# Patient Record
Sex: Female | Born: 1935 | Race: White | Hispanic: No | Marital: Single | Smoking: Never smoker
Health system: Southern US, Community
[De-identification: ages and names within clinical notes are randomized; demographics above are authoritative.]

---

## 2018-01-21 ENCOUNTER — Emergency Department (HOSPITAL_COMMUNITY): Payer: Medicare HMO

## 2018-01-21 ENCOUNTER — Emergency Department (HOSPITAL_COMMUNITY)
Admission: EM | Admit: 2018-01-21 | Discharge: 2018-01-21 | Disposition: A | Discharge status: Home / Self Care | Payer: Medicare HMO | Attending: Emergency Medicine | Admitting: Emergency Medicine

## 2018-01-21 ENCOUNTER — Encounter (HOSPITAL_COMMUNITY): Payer: Self-pay | Admitting: Emergency Medicine

## 2018-01-21 ENCOUNTER — Other Ambulatory Visit: Payer: Self-pay

## 2018-01-21 DIAGNOSIS — I480 Paroxysmal atrial fibrillation: Secondary | ICD-10-CM | POA: Insufficient documentation

## 2018-01-21 DIAGNOSIS — J181 Lobar pneumonia, unspecified organism: Secondary | ICD-10-CM | POA: Diagnosis not present

## 2018-01-21 DIAGNOSIS — Z79899 Other long term (current) drug therapy: Secondary | ICD-10-CM | POA: Insufficient documentation

## 2018-01-21 DIAGNOSIS — F0390 Unspecified dementia without behavioral disturbance: Secondary | ICD-10-CM | POA: Diagnosis not present

## 2018-01-21 DIAGNOSIS — I4891 Unspecified atrial fibrillation: Secondary | ICD-10-CM

## 2018-01-21 DIAGNOSIS — J189 Pneumonia, unspecified organism: Secondary | ICD-10-CM

## 2018-01-21 DIAGNOSIS — R0602 Shortness of breath: Secondary | ICD-10-CM | POA: Diagnosis present

## 2018-01-21 LAB — CBC WITH DIFFERENTIAL/PLATELET
Abs Immature Granulocytes: 0 10*3/uL (ref 0.0–0.1)
BASOS ABS: 0.1 10*3/uL (ref 0.0–0.1)
Basophils Relative: 1 %
EOS ABS: 0.4 10*3/uL (ref 0.0–0.7)
Eosinophils Relative: 6 %
HEMATOCRIT: 40.6 % (ref 36.0–46.0)
HEMOGLOBIN: 12.8 g/dL (ref 12.0–15.0)
Immature Granulocytes: 0 %
LYMPHS ABS: 1.5 10*3/uL (ref 0.7–4.0)
LYMPHS PCT: 25 %
MCH: 31.4 pg (ref 26.0–34.0)
MCHC: 31.5 g/dL (ref 30.0–36.0)
MCV: 99.5 fL (ref 78.0–100.0)
MONO ABS: 0.6 10*3/uL (ref 0.1–1.0)
Monocytes Relative: 10 %
Neutro Abs: 3.6 10*3/uL (ref 1.7–7.7)
Neutrophils Relative %: 58 %
Platelets: 135 10*3/uL — ABNORMAL LOW (ref 150–400)
RBC: 4.08 MIL/uL (ref 3.87–5.11)
RDW: 14.9 % (ref 11.5–15.5)
WBC: 6.1 10*3/uL (ref 4.0–10.5)

## 2018-01-21 LAB — I-STAT TROPONIN, ED: Troponin i, poc: 0.04 ng/mL (ref 0.00–0.08)

## 2018-01-21 LAB — BASIC METABOLIC PANEL
Anion gap: 7 (ref 5–15)
BUN: 25 mg/dL — AB (ref 8–23)
CO2: 23 mmol/L (ref 22–32)
CREATININE: 1.47 mg/dL — AB (ref 0.44–1.00)
Calcium: 9.2 mg/dL (ref 8.9–10.3)
Chloride: 111 mmol/L (ref 98–111)
GFR calc Af Amer: 33 mL/min — ABNORMAL LOW (ref >60)
GFR, EST NON AFRICAN AMERICAN: 29 mL/min — AB (ref >60)
GLUCOSE: 94 mg/dL (ref 70–99)
POTASSIUM: 4.8 mmol/L (ref 3.5–5.1)
SODIUM: 141 mmol/L (ref 135–145)

## 2018-01-21 LAB — DIGOXIN LEVEL: Digoxin Level: <0.2 ng/mL — ABNORMAL LOW (ref 0.8–2.0)

## 2018-01-21 LAB — I-STAT CG4 LACTIC ACID, ED: Lactic Acid, Venous: 0.95 mmol/L (ref 0.5–1.9)

## 2018-01-21 MED ORDER — DOXYCYCLINE HYCLATE 100 MG PO TABS: 100.0000 mg | ORAL_TABLET | Freq: Once | ORAL | Status: DC

## 2018-01-21 MED ORDER — DOXYCYCLINE HYCLATE 100 MG PO CAPS: 100.0000 mg | ORAL_CAPSULE | Freq: Two times a day (BID) | ORAL | 0 refills | Status: AC

## 2018-01-21 MED ORDER — FUROSEMIDE 20 MG PO TABS
40.0000 mg | ORAL_TABLET | Freq: Once | ORAL | Status: AC
  Administered 2018-01-21: 40 mg via ORAL
  Filled 2018-01-21: qty 2

## 2018-01-21 MED ORDER — DILTIAZEM HCL ER COATED BEADS 120 MG PO CP24: 120.0000 mg | ORAL_CAPSULE | Freq: Every day | ORAL | 0 refills | Status: AC

## 2018-01-21 MED ORDER — ALBUTEROL SULFATE (2.5 MG/3ML) 0.083% IN NEBU
5.0000 mg | INHALATION_SOLUTION | Freq: Once | RESPIRATORY_TRACT | Status: DC
  Filled 2018-01-21: qty 6

## 2018-01-21 MED ORDER — DOXYCYCLINE HYCLATE 100 MG PO TABS
100.0000 mg | ORAL_TABLET | Freq: Once | ORAL | Status: AC
  Administered 2018-01-21: 100 mg via ORAL
  Filled 2018-01-21: qty 1

## 2018-01-21 MED ORDER — DILTIAZEM HCL 25 MG/5ML IV SOLN
10.0000 mg | Freq: Once | INTRAVENOUS | Status: AC
  Administered 2018-01-21: 10 mg via INTRAVENOUS
  Filled 2018-01-21: qty 5

## 2018-01-21 MED ORDER — DIGOXIN 125 MCG PO TABS
0.1250 mg | ORAL_TABLET | Freq: Once | ORAL | Status: AC
  Administered 2018-01-21: 0.125 mg via ORAL
  Filled 2018-01-21: qty 1

## 2018-01-21 MED ORDER — DILTIAZEM HCL ER COATED BEADS 120 MG PO CP24
120.0000 mg | ORAL_CAPSULE | Freq: Once | ORAL | Status: AC
  Administered 2018-01-21: 120 mg via ORAL
  Filled 2018-01-21: qty 1

## 2018-01-21 NOTE — ED Provider Notes (Addendum)
[place] EMERGENCY DEPARTMENT Provider Note   CSN: [id number] Arrival date & time: 01/21/18  0818     History   Chief Complaint Chief Complaint  Patient presents with  . Shortness of Breath    HPI Andrea Shepard is a 84 y.o. female.  The history is provided by the EMS personnel, a relative and the patient. No language interpreter was used.  Shortness of Breath    Andrea Shepard is a 84 y.o. female who presents to the Emergency Department complaining of sob.  Level V caveat due to dementia.  Hx is provided by EMS and daughter. The resident at [facility] place and presents to the emergency department for difficulty breathing that started today. She awoke with shortness of breath and had wheezing prior to ED arrival. No reports of recent illnesses. She is in persistent atrial fibrillation. She does have a history of congestive heart failure. She has severe Alzheimer's and is confused at baseline. History reviewed. No pertinent past medical history.  There are no active problems to display for this patient.   History reviewed. No pertinent surgical history.   OB History   None      Home Medications    Prior to Admission medications   Medication Sig Start Date End Date Taking? Authorizing Provider  acetaminophen (TYLENOL) 325 MG tablet Take 650 mg by mouth every evening.   Yes [provider]  digoxin (LANOXIN) 0.125 MG tablet Take 0.125 mg by mouth daily.   Yes [provider]  famotidine (PEPCID) 40 MG tablet Take 80 mg by mouth every morning.   Yes [provider]  furosemide (LASIX) 40 MG tablet Take 40 mg by mouth daily.   Yes [provider]  Melatonin 5 MG TABS Take 5 mg by mouth at bedtime.   Yes [provider]  mirtazapine (REMERON) 7.5 MG tablet Take 7.5 mg by mouth at bedtime.   Yes [provider]  polycarbophil (FIBERCON) 625 MG tablet Take 625 mg by mouth at bedtime.   Yes [provider]  potassium chloride SA (K-DUR,KLOR-CON) 20 MEQ tablet Take 20 mEq by mouth 2 (two) times daily.   Yes [provider]  senna (SENOKOT) 8.6 MG tablet Take 1 tablet by mouth every evening.   Yes [provider]  diltiazem (CARDIZEM CD) 120 MG 24 hr capsule Take 1 capsule (120 mg total) by mouth daily. 01/22/18   Quintella Reichert, MD  doxycycline (VIBRAMYCIN) 100 MG capsule Take 1 capsule (100 mg total) by mouth 2 (two) times daily. 01/21/18   Quintella Reichert, MD    Family History History reviewed. No pertinent family history.  Social History Social History   Tobacco Use  . Smoking status: Not on file  Substance Use Topics  . Alcohol use: Not on file  . Drug use: Not on file     Allergies   Levaquin [levofloxacin]   Review of Systems Review of Systems  Unable to perform ROS: Dementia  Respiratory: Positive for shortness of breath.      Physical Exam Updated Vital Signs BP (!) 126/96   Pulse (!) 110   Temp 98 F (36.7 C) (Rectal)   Resp (!) 22   Ht 5\' 3"  (1.6 m)   Wt 56.7 kg   SpO2 94%   BMI 22.14 kg/m   Physical Exam  Constitutional: She appears well-developed and well-nourished.  HENT:  Head: Normocephalic and atraumatic.  Cardiovascular:  No murmur heard. Irregular rhythm  Pulmonary/Chest:  Effort normal. No respiratory distress.  Decreased air movement and bilateral bases  Abdominal: Soft. There is no tenderness. There is no rebound and no guarding.  Musculoskeletal: She exhibits no edema or tenderness.  Neurological: She is alert.  Disoriented to place and time. Moves all extremities symmetrically.  Skin: Skin is warm and dry.  Psychiatric: She has a normal mood and affect. Her behavior is normal.  Nursing note and vitals reviewed.    ED Treatments / Results  Labs (all labs ordered are listed, but only abnormal results are displayed) Labs Reviewed  BASIC METABOLIC PANEL - Abnormal; Notable for the following components:      Result  Value   BUN 25 (*)    Creatinine, Ser 1.47 (*)    GFR calc non Af Amer 29 (*)    GFR calc Af Amer 33 (*)    All other components within normal limits  CBC WITH DIFFERENTIAL/PLATELET - Abnormal; Notable for the following components:   Platelets 135 (*)    All other components within normal limits  DIGOXIN LEVEL - Abnormal; Notable for the following components:   Digoxin Level <0.2 (*)    All other components within normal limits  I-STAT TROPONIN, ED  I-STAT CG4 LACTIC ACID, ED  I-STAT CG4 LACTIC ACID, ED    EKG EKG Interpretation  Date/Time:  Friday January 21 2018 08:23:21 EDT Ventricular Rate:  110 PR Interval:    QRS Duration: 74 QT Interval:  344 QTC Calculation: 466 R Axis:   0 Text Interpretation:  Atrial fibrillation Anterior infarct, old no prior available for comparison Confirmed by Quintella Reichert [phone]) on 01/21/2018 9:10:28 AM Also confirmed by Quintella Reichert [phone]), editor Lynder Parents [phone])  on 01/21/2018 3:43:56 PM   Radiology Dg Chest 2 View  Result Date: 01/21/2018 CLINICAL DATA:  Short of breath.  Confused. EXAM: CHEST - 2 VIEW COMPARISON:  None. FINDINGS: Mild enlargement of the cardiopericardial silhouette. No mediastinal or hilar masses. Hazy airspace opacity is noted in the right perihilar and lower lung regions. Left lung is clear. No convincing pleural effusion.  No pneumothorax. Skeletal structures are demineralized but intact. IMPRESSION: 1. Hazy airspace opacity in the right perihilar and lower lung zone consistent with pneumonia in the proper clinical setting. This could alternatively reflect asymmetric edema. 2. Mild cardiomegaly. Electronically Signed   By: Lajean Manes M.D.   On: 01/21/2018 09:30    Procedures Procedures (including critical care time) CRITICAL CARE Performed by: Quintella Reichert   Total critical care time: 35 minutes  Critical care time was exclusive of separately billable procedures and treating other  patients.  Critical care was necessary to treat or prevent imminent or life-threatening deterioration.  Critical care was time spent personally by me on the following activities: development of treatment plan with patient and/or surrogate as well as nursing, discussions with consultants, evaluation of patient's response to treatment, examination of patient, obtaining history from patient or surrogate, ordering and performing treatments and interventions, ordering and review of laboratory studies, ordering and review of radiographic studies, pulse oximetry and re-evaluation of patient's condition.  Medications Ordered in ED Medications  diltiazem (CARDIZEM CD) 24 hr capsule 120 mg (has no administration in time range)  doxycycline (VIBRA-TABS) tablet 100 mg (has no administration in time range)  doxycycline (VIBRA-TABS) tablet 100 mg (100 mg Oral Given 01/21/18 1248)  digoxin (LANOXIN) tablet 0.125 mg (0.125 mg Oral Given 01/21/18 1626)  furosemide (LASIX) tablet 40 mg (40 mg Oral Given 01/21/18 1625)  diltiazem (CARDIZEM) injection 10 mg (10 mg Intravenous Given 01/21/18 1751)     Initial Impression / Assessment and Plan / ED Course  I have reviewed the triage vital signs and the nursing notes.  Pertinent labs & imaging results that were available during my care of the patient were reviewed by me and considered in my medical decision making (see chart for details).     Patient with history of Alzheimer's here for evaluation of shortness of breath. She is markedly confused on ED arrival but alert and at her baseline. She has no respiratory distress on initial evaluation. Discussed with patient's daughter, [name] at [phone]. Plan to obtain chest x-ray and labs and reassess. Patient's daughter would prefer outpatient management of current illness if this is possible.  There was delay in obtaining digoxin level. On repeat assessment patient does have mild agitation and is now in a fib with  RVR rates 130s to 160s. Pt did miss her morning digoxin dose and this was administered during ED stay. Attempted multiple times to recontact daughter regarding change in patient status. At 1800 able to contact patient's daughter at [phone]. Discussed change in status with a fib with RVR. Patient's heart rate did improve following Cardizem bolus. Discussed recommendation for admission.  Daughter would like to try outpatient management as patient would not want any invasive interventions and wants to avoid all hospitalizations due to advanced dementia.  Final Clinical Impressions(s) / ED Diagnoses   Final diagnoses:  Atrial fibrillation with rapid ventricular response ([place])  Community acquired pneumonia of right lower lobe of lung [facility])    ED Discharge Orders         Ordered    diltiazem (CARDIZEM CD) 120 MG 24 hr capsule  Daily     01/21/18 1806    doxycycline (VIBRAMYCIN) 100 MG capsule  2 times daily     01/21/18 1806           Quintella Reichert, MD 01/21/18 Cecille Amsterdam    Quintella Reichert, MD 01/21/18 1813    Quintella Reichert, MD 01/31/18 2310

## 2018-01-21 NOTE — ED Notes (Signed)
Pt ambulatory in hallway with no assistance from this NT. Required minimal assistance while standing and direction when sitting and returning to bedside. Pt SpO2 93% prior to ambulating, and was 95% or higher while ambulating. SpO2 100% upon returning to bedside.

## 2018-01-21 NOTE — ED Triage Notes (Addendum)
Per EMS pt is from [place], she woke up at 0600 and stated she was SOB and the staff said she was wheezing.  When EMS arrived they did not notice any distress or wheezing.  Pt denies pain, dizziness or SOB.  Pt not on O2 currently, and is not on it at the facility.  It was noted that she was in AFIB in route and to their knowledge she is not on blood thinners.   NAD noted at this time AOx3 history of Dementia GCS of 14

## 2018-01-21 NOTE — Discharge Instructions (Addendum)
Andrea Shepard was seen in the emergency department for shortness of breath today. She has a possible pneumonia in her right lung and she was also found to have rapid atrial fibrillation. Please continue her home medications. She received two doses of doxycycline today in the emergency department and her next dose will be tomorrow morning. She received a dose of Cardizem in the emergency department as well and her next dose will be tomorrow morning. Have her rechecked if she has any new or concerning symptoms.

## 2018-01-21 NOTE — ED Notes (Signed)
Pt ambulated to bathroom and back to room.

## 2019-04-25 ENCOUNTER — Other Ambulatory Visit: Payer: Self-pay

## 2019-04-25 ENCOUNTER — Emergency Department (HOSPITAL_COMMUNITY): Payer: Medicare HMO

## 2019-04-25 ENCOUNTER — Inpatient Hospital Stay (HOSPITAL_COMMUNITY)
Admission: EM | Admit: 2019-04-25 | Discharge: 2019-05-29 | DRG: 177 | Disposition: E | Payer: Medicare HMO | Source: Skilled Nursing Facility | Attending: Internal Medicine | Admitting: Internal Medicine

## 2019-04-25 DIAGNOSIS — Z79890 Hormone replacement therapy (postmenopausal): Secondary | ICD-10-CM

## 2019-04-25 DIAGNOSIS — F0280 Dementia in other diseases classified elsewhere without behavioral disturbance: Secondary | ICD-10-CM | POA: Diagnosis present

## 2019-04-25 DIAGNOSIS — G9341 Metabolic encephalopathy: Secondary | ICD-10-CM | POA: Diagnosis present

## 2019-04-25 DIAGNOSIS — Z515 Encounter for palliative care: Secondary | ICD-10-CM | POA: Diagnosis not present

## 2019-04-25 DIAGNOSIS — N179 Acute kidney failure, unspecified: Secondary | ICD-10-CM | POA: Diagnosis present

## 2019-04-25 DIAGNOSIS — R4182 Altered mental status, unspecified: Secondary | ICD-10-CM | POA: Diagnosis not present

## 2019-04-25 DIAGNOSIS — E860 Dehydration: Secondary | ICD-10-CM | POA: Diagnosis present

## 2019-04-25 DIAGNOSIS — A419 Sepsis, unspecified organism: Secondary | ICD-10-CM | POA: Diagnosis not present

## 2019-04-25 DIAGNOSIS — Z781 Physical restraint status: Secondary | ICD-10-CM

## 2019-04-25 DIAGNOSIS — Z66 Do not resuscitate: Secondary | ICD-10-CM | POA: Diagnosis present

## 2019-04-25 DIAGNOSIS — U071 COVID-19: Secondary | ICD-10-CM | POA: Diagnosis present

## 2019-04-25 DIAGNOSIS — T68XXXA Hypothermia, initial encounter: Secondary | ICD-10-CM | POA: Diagnosis present

## 2019-04-25 DIAGNOSIS — Z681 Body mass index (BMI) 19 or less, adult: Secondary | ICD-10-CM | POA: Diagnosis not present

## 2019-04-25 DIAGNOSIS — Z79899 Other long term (current) drug therapy: Secondary | ICD-10-CM | POA: Diagnosis not present

## 2019-04-25 DIAGNOSIS — J1282 Pneumonia due to coronavirus disease 2019: Secondary | ICD-10-CM | POA: Diagnosis present

## 2019-04-25 DIAGNOSIS — R636 Underweight: Secondary | ICD-10-CM | POA: Diagnosis present

## 2019-04-25 DIAGNOSIS — R339 Retention of urine, unspecified: Secondary | ICD-10-CM | POA: Diagnosis present

## 2019-04-25 DIAGNOSIS — R627 Adult failure to thrive: Secondary | ICD-10-CM | POA: Diagnosis present

## 2019-04-25 DIAGNOSIS — F0390 Unspecified dementia without behavioral disturbance: Secondary | ICD-10-CM | POA: Diagnosis present

## 2019-04-25 DIAGNOSIS — G309 Alzheimer's disease, unspecified: Secondary | ICD-10-CM | POA: Diagnosis not present

## 2019-04-25 DIAGNOSIS — E870 Hyperosmolality and hypernatremia: Secondary | ICD-10-CM | POA: Diagnosis present

## 2019-04-25 DIAGNOSIS — X58XXXA Exposure to other specified factors, initial encounter: Secondary | ICD-10-CM | POA: Diagnosis present

## 2019-04-25 DIAGNOSIS — Z881 Allergy status to other antibiotic agents status: Secondary | ICD-10-CM

## 2019-04-25 DIAGNOSIS — Z800 Family history of malignant neoplasm of digestive organs: Secondary | ICD-10-CM

## 2019-04-25 DIAGNOSIS — Z7189 Other specified counseling: Secondary | ICD-10-CM | POA: Diagnosis not present

## 2019-04-25 DIAGNOSIS — N184 Chronic kidney disease, stage 4 (severe): Secondary | ICD-10-CM | POA: Diagnosis present

## 2019-04-25 LAB — CBC WITH DIFFERENTIAL/PLATELET
Abs Immature Granulocytes: 0.12 10*3/uL — ABNORMAL HIGH (ref 0.00–0.07)
Basophils Absolute: 0 10*3/uL (ref 0.0–0.1)
Basophils Relative: 0 %
Eosinophils Absolute: 0 10*3/uL (ref 0.0–0.5)
Eosinophils Relative: 0 %
HCT: 65.4 % — ABNORMAL HIGH (ref 36.0–46.0)
Hemoglobin: 20.4 g/dL — ABNORMAL HIGH (ref 12.0–15.0)
Immature Granulocytes: 1 %
Lymphocytes Relative: 3 %
Lymphs Abs: 0.6 10*3/uL — ABNORMAL LOW (ref 0.7–4.0)
MCH: 31 pg (ref 26.0–34.0)
MCHC: 31.2 g/dL (ref 30.0–36.0)
MCV: 99.4 fL (ref 80.0–100.0)
Monocytes Absolute: 0.5 10*3/uL (ref 0.1–1.0)
Monocytes Relative: 3 %
Neutro Abs: 15.3 10*3/uL — ABNORMAL HIGH (ref 1.7–7.7)
Neutrophils Relative %: 93 %
Platelets: 166 10*3/uL (ref 150–400)
RBC: 6.58 MIL/uL — ABNORMAL HIGH (ref 3.87–5.11)
RDW: 14.8 % (ref 11.5–15.5)
WBC: 16.5 10*3/uL — ABNORMAL HIGH (ref 4.0–10.5)
nRBC: 0 % (ref 0.0–0.2)

## 2019-04-25 LAB — COMPREHENSIVE METABOLIC PANEL
ALT: 95 U/L — ABNORMAL HIGH (ref 0–44)
AST: 57 U/L — ABNORMAL HIGH (ref 15–41)
Albumin: 4 g/dL (ref 3.5–5.0)
Alkaline Phosphatase: 80 U/L (ref 38–126)
Anion gap: 14 (ref 5–15)
BUN: 98 mg/dL — ABNORMAL HIGH (ref 8–23)
CO2: 27 mmol/L (ref 22–32)
Calcium: 9.6 mg/dL (ref 8.9–10.3)
Chloride: 116 mmol/L — ABNORMAL HIGH (ref 98–111)
Creatinine, Ser: 1.81 mg/dL — ABNORMAL HIGH (ref 0.44–1.00)
GFR calc Af Amer: 27 mL/min — ABNORMAL LOW (ref >60)
GFR calc non Af Amer: 23 mL/min — ABNORMAL LOW (ref >60)
Glucose, Bld: 121 mg/dL — ABNORMAL HIGH (ref 70–99)
Potassium: 4.6 mmol/L (ref 3.5–5.1)
Sodium: 157 mmol/L — ABNORMAL HIGH (ref 135–145)
Total Bilirubin: 0.9 mg/dL (ref 0.3–1.2)
Total Protein: 7.7 g/dL (ref 6.5–8.1)

## 2019-04-25 LAB — URINALYSIS, ROUTINE W REFLEX MICROSCOPIC
Bilirubin Urine: NEGATIVE
Glucose, UA: NEGATIVE mg/dL
Hgb urine dipstick: NEGATIVE
Ketones, ur: NEGATIVE mg/dL
Leukocytes,Ua: NEGATIVE
Nitrite: NEGATIVE
Protein, ur: NEGATIVE mg/dL
Specific Gravity, Urine: 1.011 (ref 1.005–1.030)
pH: 5 (ref 5.0–8.0)

## 2019-04-25 LAB — PROTIME-INR
INR: 1 (ref 0.8–1.2)
Prothrombin Time: 13 seconds (ref 11.4–15.2)

## 2019-04-25 LAB — LACTIC ACID, PLASMA: Lactic Acid, Venous: 2.3 mmol/L (ref 0.5–1.9)

## 2019-04-25 LAB — APTT: aPTT: 28 seconds (ref 24–36)

## 2019-04-25 LAB — DIGOXIN LEVEL: Digoxin Level: 1.7 ng/mL (ref 0.8–2.0)

## 2019-04-25 MED ORDER — VANCOMYCIN HCL IN DEXTROSE 1-5 GM/200ML-% IV SOLN
1000.0000 mg | Freq: Once | INTRAVENOUS | Status: AC
  Administered 2019-04-25: 1000 mg via INTRAVENOUS
  Filled 2019-04-25: qty 200

## 2019-04-25 MED ORDER — LORAZEPAM 2 MG/ML IJ SOLN
0.5000 mg | Freq: Four times a day (QID) | INTRAMUSCULAR | Status: DC | PRN
  Administered 2019-04-26 – 2019-04-28 (×3): 0.5 mg via INTRAVENOUS
  Filled 2019-04-25 (×4): qty 1

## 2019-04-25 MED ORDER — ACETAMINOPHEN 325 MG PO TABS: 650.0000 mg | ORAL_TABLET | Freq: Four times a day (QID) | ORAL | Status: DC | PRN

## 2019-04-25 MED ORDER — SODIUM CHLORIDE 0.9 % IV SOLN: INTRAVENOUS | Status: DC

## 2019-04-25 MED ORDER — ONDANSETRON HCL 4 MG PO TABS: 4.0000 mg | ORAL_TABLET | Freq: Four times a day (QID) | ORAL | Status: DC | PRN

## 2019-04-25 MED ORDER — ACETAMINOPHEN 650 MG RE SUPP: 650.0000 mg | Freq: Four times a day (QID) | RECTAL | Status: DC | PRN

## 2019-04-25 MED ORDER — SODIUM CHLORIDE 0.9 % IV SOLN
2.0000 g | Freq: Once | INTRAVENOUS | Status: AC
  Administered 2019-04-25: 2 g via INTRAVENOUS
  Filled 2019-04-25: qty 2

## 2019-04-25 MED ORDER — METRONIDAZOLE IN NACL 5-0.79 MG/ML-% IV SOLN
500.0000 mg | Freq: Once | INTRAVENOUS | Status: AC
  Administered 2019-04-25: 500 mg via INTRAVENOUS
  Filled 2019-04-25: qty 100

## 2019-04-25 MED ORDER — ONDANSETRON HCL 4 MG/2ML IJ SOLN: 4.0000 mg | Freq: Four times a day (QID) | INTRAMUSCULAR | Status: DC | PRN

## 2019-04-25 MED ORDER — ENOXAPARIN SODIUM 30 MG/0.3ML ~~LOC~~ SOLN
30.0000 mg | Freq: Every day | SUBCUTANEOUS | Status: DC
  Administered 2019-04-26: 30 mg via SUBCUTANEOUS
  Filled 2019-04-25: qty 0.3

## 2019-04-25 MED ORDER — SODIUM CHLORIDE 0.9 % IV BOLUS (SEPSIS)
1000.0000 mL | Freq: Once | INTRAVENOUS | Status: AC
  Administered 2019-04-25: 1000 mL via INTRAVENOUS

## 2019-04-25 NOTE — Progress Notes (Signed)
A consult was received from an ED physician for Vancomycin & Cefepime per pharmacy dosing.  The patient's profile has been reviewed for ht/wt/allergies/indication/available labs. No current weight, last weight noted was 01/21/2018 of 57 kg   A one time order has been placed for Vancomycin 1gm, Flagyl 500mg  and Cefepime 2gm per ED MD.  Further antibiotics/pharmacy consults should be ordered by admitting physician if indicated.                       Thank you,  Minda Ditto PharmD 03/29/2019  8:14 PM

## 2019-04-25 NOTE — ED Notes (Signed)
Date and time results received: 04/12/2019 2002 (use smartphrase ".now" to insert current time)  Test: Lactic Acid Critical Value: 2.3  Name of Provider Notified: Tamera Punt, MD  Orders Received? Or Actions Taken?: Orders Received - See Orders for details

## 2019-04-25 NOTE — ED Notes (Signed)
Pt transferred to CT.

## 2019-04-25 NOTE — ED Notes (Signed)
Fluid bolus and IV antibiotic infused repeat lactic acid done now.

## 2019-04-25 NOTE — ED Provider Notes (Signed)
[place] DEPT Provider Note   CSN: [id number] Arrival date & time: 03/29/2019  [id number]     History Chief Complaint  Patient presents with  . Altered Mental Status    Andrea Shepard is a 85 y.o. female.  Patient is a 85 year old female who resides at [facility] facility.  She was brought in by EMS for increased weakness.  She has a history of dementia and according to EMS has had declining over the last 2 days.  She normally is ambulatory and active but she has not been out of her bed since yesterday and has not been eating or drinking.  She has confusion at baseline.  She reportedly had Covid earlier in December but has had a negative test since that time.  History is limited due to patient's dementia.        No past medical history on file.  There are no problems to display for this patient.   No past surgical history on file.   OB History   No obstetric history on file.     No family history on file.  Social History   Tobacco Use  . Smoking status: Not on file  Substance Use Topics  . Alcohol use: Not on file  . Drug use: Not on file    Home Medications Prior to Admission medications   Medication Sig Start Date End Date Taking? Authorizing Provider  acetaminophen (TYLENOL) 325 MG tablet Take 650 mg by mouth every 6 (six) hours as needed for moderate pain or fever.   Yes [provider]  dexamethasone (DECADRON) 6 MG tablet Take 6 mg by mouth daily. For 10 days started on 12.21.2020   Yes [provider]  digoxin (LANOXIN) 0.125 MG tablet Take 0.125 mg by mouth daily.   Yes [provider]  diltiazem (CARDIZEM CD) 120 MG 24 hr capsule Take 1 capsule (120 mg total) by mouth daily. 01/22/18  Yes Quintella Reichert, MD  famotidine (PEPCID) 20 MG tablet Take 20 mg by mouth 2 (two) times daily as needed for heartburn.    Yes [provider]  furosemide (LASIX) 40 MG tablet Take 40 mg by mouth  daily.   Yes [provider]  levothyroxine (SYNTHROID) 25 MCG tablet Take 25 mcg by mouth daily before breakfast.   Yes [provider]  mirtazapine (REMERON) 7.5 MG tablet Take 7.5 mg by mouth at bedtime.   Yes [provider]  polycarbophil (FIBERCON) 625 MG tablet Take 625 mg by mouth at bedtime.   Yes [provider]  potassium chloride SA (K-DUR,KLOR-CON) 20 MEQ tablet Take 20 mEq by mouth at bedtime.    Yes [provider]  senna (SENOKOT) 8.6 MG tablet Take 1 tablet by mouth every evening.   Yes [provider]  doxycycline (VIBRAMYCIN) 100 MG capsule Take 1 capsule (100 mg total) by mouth 2 (two) times daily. Patient not taking: Reported on 04/03/2019 01/21/18   Quintella Reichert, MD    Allergies    Levaquin [levofloxacin]  Review of Systems   Review of Systems  Unable to perform ROS: Dementia    Physical Exam Updated Vital Signs BP 112/75 (BP Location: Left Arm)   Pulse 78   Temp (!) 95.5 F (35.3 C) (Rectal)   Resp 14   SpO2 97%   Physical Exam Constitutional:      Appearance: She is well-developed.  HENT:     Head: Normocephalic and atraumatic.  Mouth/Throat:     Mouth: Mucous membranes are dry.  Eyes:     Pupils: Pupils are equal, round, and reactive to light.  Cardiovascular:     Rate and Rhythm: Normal rate. Rhythm irregular.     Heart sounds: Normal heart sounds.  Pulmonary:     Effort: Pulmonary effort is normal. No respiratory distress.     Breath sounds: Normal breath sounds. No wheezing or rales.  Chest:     Chest wall: No tenderness.  Abdominal:     General: Bowel sounds are normal.     Palpations: Abdomen is soft.     Tenderness: There is abdominal tenderness (Diffuse tenderness on palpation). There is no guarding or rebound.  Musculoskeletal:        General: Normal range of motion.     Cervical back: Normal range of motion and neck supple.  Lymphadenopathy:     Cervical: No cervical  adenopathy.  Skin:    General: Skin is warm and dry.     Findings: No rash.  Neurological:     Mental Status: She is alert.     Comments: Patient is awake with eyes open but will not answer questions.  Will not follow commands.  I do not appreciate any focal deficits.     ED Results / Procedures / Treatments   Labs (all labs ordered are listed, but only abnormal results are displayed) Labs Reviewed  LACTIC ACID, PLASMA - Abnormal; Notable for the following components:      Result Value   Lactic Acid, Venous 2.3 (*)    All other components within normal limits  COMPREHENSIVE METABOLIC PANEL - Abnormal; Notable for the following components:   Sodium 157 (*)    Chloride 116 (*)    Glucose, Bld 121 (*)    BUN 98 (*)    Creatinine, Ser 1.81 (*)    AST 57 (*)    ALT 95 (*)    GFR calc non Af Amer 23 (*)    GFR calc Af Amer 27 (*)    All other components within normal limits  CBC WITH DIFFERENTIAL/PLATELET - Abnormal; Notable for the following components:   WBC 16.5 (*)    RBC 6.58 (*)    Hemoglobin 20.4 (*)    HCT 65.4 (*)    Neutro Abs 15.3 (*)    Lymphs Abs 0.6 (*)    Abs Immature Granulocytes 0.12 (*)    All other components within normal limits  CULTURE, BLOOD (ROUTINE X 2)  CULTURE, BLOOD (ROUTINE X 2)  URINE CULTURE  SARS CORONAVIRUS 2 (TAT 6-24 HRS)  APTT  PROTIME-INR  DIGOXIN LEVEL  LACTIC ACID, PLASMA  URINALYSIS, ROUTINE W REFLEX MICROSCOPIC    EKG EKG Interpretation  Date/Time:  Tuesday April 25 2019 18:11:38 EST Ventricular Rate:  67 PR Interval:    QRS Duration: 82 QT Interval:  384 QTC Calculation: 406 R Axis:   -52 Text Interpretation: Atrial fibrillation Ventricular premature complex LVH with secondary repolarization abnormality Inferior infarct, old Anterior infarct, age indeterminate new ST depression Confirmed by Malvin Johns [phone]) on 04/19/2019 6:21:41 PM   Radiology CT Abdomen Pelvis Wo Contrast  Result Date: 04/26/2019 CLINICAL  DATA:  Weakness. EXAM: CT ABDOMEN AND PELVIS WITHOUT CONTRAST TECHNIQUE: Multidetector CT imaging of the abdomen and pelvis was performed following the standard protocol without IV contrast. COMPARISON:  None. FINDINGS: Lower chest: No acute abnormality. Scarring/atelectasis at the right lung base. Hepatobiliary: No focal liver abnormality. Status post cholecystectomy. Moderate  intra and extrahepatic biliary dilatation. Pancreas: Atrophic. No ductal dilatation or surrounding inflammatory changes. Spleen: Normal in size without focal abnormality. Adrenals/Urinary Tract: The adrenal glands are unremarkable. Left greater than right renal atrophy. 1.4 cm hyperdense lesion in the upper pole of the left kidney likely a proteinaceous or hemorrhagic cyst. No renal calculi or hydronephrosis. Moderately distended bladder without wall thickening. Stomach/Bowel: Stomach is within normal limits. Appendix appears normal. No evidence of bowel wall thickening, distention, or inflammatory changes. Left-sided colonic diverticulosis. Vascular/Lymphatic: Aortic atherosclerosis. No enlarged abdominal or pelvic lymph nodes. Reproductive: Uterus and bilateral adnexa are unremarkable. Other: No abdominal wall hernia or abnormality. No abdominopelvic ascites. No pneumoperitoneum. Musculoskeletal: No acute or significant osseous findings. IMPRESSION: 1.  No acute intra-abdominal process. 2. Moderate intra and extrahepatic biliary dilatation may be related to post cholecystectomy state. Correlate with LFTs. 3. Moderately distended bladder.  Correlate for urinary retention. 4.  Aortic atherosclerosis (ICD10-I70.0). Electronically Signed   By: Titus Dubin M.D.   On: 04/22/2019 19:28   CT Head Wo Contrast  Result Date: 04/12/2019 CLINICAL DATA:  Altered mental status EXAM: CT HEAD WITHOUT CONTRAST TECHNIQUE: Contiguous axial images were obtained from the base of the skull through the vertex without intravenous contrast. COMPARISON:   None. FINDINGS: Brain: No evidence of acute infarction, hemorrhage, extra-axial collection, ventriculomegaly, or mass effect. 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 Mild encephalomalacia of the right temporal lobe. Generalized cerebral atrophy. Periventricular white matter low attenuation likely secondary to microangiopathy. Vascular: Cerebrovascular atherosclerotic calcifications are noted. Skull: Negative for fracture or focal lesion. Sinuses/Orbits: Visualized portions of the orbits are unremarkable. Visualized portions of the paranasal sinuses are unremarkable. Visualized portions of the mastoid air cells are unremarkable. Other: None. IMPRESSION: 1.   1.  No acute intracranial pathology. 2. Chronic microvascular disease and cerebral atrophy. 3. 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 Mild encephalomalacia of the right temporal lobe. Electronically Signed   By: Kathreen Devoid   On: 03/28/2019 19:24   DG Chest Port 1 View  Result Date: 03/28/2019 CLINICAL DATA:  Acute altered mental status and weakness. EXAM: PORTABLE CHEST 1 VIEW COMPARISON:  01/21/2018 FINDINGS: Stable mild cardiomegaly. Aortic atherosclerosis incidentally noted. Mild scarring is seen in the right lung base. No evidence of pulmonary infiltrate or edema. No evidence of pneumothorax or pleural effusion. IMPRESSION: Mild cardiomegaly.  No active lung disease. Electronically Signed   By: Marlaine Hind M.D.   On: 03/29/2019 19:12    Procedures Procedures (including critical care time)  Medications Ordered in ED Medications  ceFEPIme (MAXIPIME) 2 g in sodium chloride 0.9 % 100 mL IVPB (has no administration in time range)  metroNIDAZOLE (FLAGYL) IVPB 500 mg (has no administration in time range)  vancomycin (VANCOCIN) IVPB 1000 mg/200 mL premix (has no administration in time range)  sodium chloride 0.9 % bolus 1,000 mL (has no administration in time range)    ED Course  I  have reviewed the triage vital signs and the nursing notes.  Pertinent labs & imaging results that were available during my care of the patient were reviewed by me and considered in my medical decision making (see chart for details).    MDM Rules/Calculators/A&P                      Patient is a 85 year old female who presents with altered mental status.  She was hypothermic on arrival and was treated for presumed sepsis.  She was placed on a [employer].  Her white count is noted to be elevated.  She was tender in her abdomen on exam so a CT scan was performed which showed no acute abnormality other than some possible urinary retention.  Her chest x-ray shows no evidence of pneumonia or signs of Covid.  Her Covid test is pending.  Her other labs are nonconcerning.  She has not been hypotensive.  She was treated with IV fluids and broad-spectrum antibiotics.  We will go ahead and place a Foley catheter for the probable urinary retention and a urinalysis is still pending.  I spoke with Dr. Shanon Brow who will admit the patient for further treatment.  CRITICAL CARE Performed by: Malvin Johns Total critical care time: 60 minutes Critical care time was exclusive of separately billable procedures and treating other patients. Critical care was necessary to treat or prevent imminent or life-threatening deterioration. Critical care was time spent personally by me on the following activities: development of treatment plan with patient and/or surrogate as well as nursing, discussions with consultants, evaluation of patient's response to treatment, examination of patient, obtaining history from patient or surrogate, ordering and performing treatments and interventions, ordering and review of laboratory studies, ordering and review of radiographic studies, pulse oximetry and re-evaluation of patient's condition.  Final Clinical Impression(s) / ED Diagnoses Final diagnoses:  Altered mental status, unspecified  altered mental status type  Sepsis, due to unspecified organism, unspecified whether acute organ dysfunction present [facility])  Urinary retention    Rx / DC Orders ED Discharge Orders    None       Malvin Johns, MD 04/17/2019 2029

## 2019-04-25 NOTE — ED Notes (Signed)
The patients daughter, Zane Herald, said she is POA and would like a call with an update. [phone].

## 2019-04-25 NOTE — H&P (Signed)
History and Physical    Andrea Shepard [id number] DOB: February 25, 1920 DOA: 04/01/2019  PCP: [name], MD  Patient coming from: Nursing home  Chief Complaint: Confusion  HPI: Andrea Shepard is a 85 y.o. female with medical history significant of dementia from nursing home sent in because of altered mental status and not acting her normal.  At patient's baseline she is ambulatory and interactive she had a marked decline in the last 24 hours.  No history can be obtained from patient due to her dementia.  On arrival her temperature was found to be 95.5 with an elevated white count.  Patient thought to be septic of unclear source.  Her Covid screen is pending.  She is been placed on a bear hugger and referred for admission for likely sepsis probably from UTI.   Review of Systems: Unobtainable secondary to dementia  No past medical history on file.  Unknown due to him dementia  No past surgical history on file.  Unknown due to dementia   has no history on file for tobacco, alcohol, and drug.  Unknown due to dementia  Allergies  Allergen Reactions  . Levaquin [Levofloxacin] Other (See Comments)    unknown reaction    No family history on file.  Unknown due to dementia  Prior to Admission medications   Medication Sig Start Date End Date Taking? Authorizing Provider  acetaminophen (TYLENOL) 325 MG tablet Take 650 mg by mouth every 6 (six) hours as needed for moderate pain or fever.   Yes [provider]  dexamethasone (DECADRON) 6 MG tablet Take 6 mg by mouth daily. For 10 days started on 12.21.2020   Yes [provider]  digoxin (LANOXIN) 0.125 MG tablet Take 0.125 mg by mouth daily.   Yes [provider]  diltiazem (CARDIZEM CD) 120 MG 24 hr capsule Take 1 capsule (120 mg total) by mouth daily. 01/22/18  Yes Quintella Reichert, MD  famotidine (PEPCID) 20 MG tablet Take 20 mg by mouth 2 (two) times daily as needed for heartburn.    Yes [provider]    furosemide (LASIX) 40 MG tablet Take 40 mg by mouth daily.   Yes [provider]  levothyroxine (SYNTHROID) 25 MCG tablet Take 25 mcg by mouth daily before breakfast.   Yes [provider]  mirtazapine (REMERON) 7.5 MG tablet Take 7.5 mg by mouth at bedtime.   Yes [provider]  polycarbophil (FIBERCON) 625 MG tablet Take 625 mg by mouth at bedtime.   Yes [provider]  potassium chloride SA (K-DUR,KLOR-CON) 20 MEQ tablet Take 20 mEq by mouth at bedtime.    Yes [provider]  senna (SENOKOT) 8.6 MG tablet Take 1 tablet by mouth every evening.   Yes [provider]  doxycycline (VIBRAMYCIN) 100 MG capsule Take 1 capsule (100 mg total) by mouth 2 (two) times daily. Patient not taking: Reported on 04/14/2019 01/21/18   Quintella Reichert, MD    Physical Exam: Vitals:   04/10/2019 1811 04/02/2019 2044  BP: 112/75   Pulse: 78   Resp: 14   Temp: (!) 95.5 F (35.3 C) (!) 95.5 F (35.3 C)  TempSrc: Rectal Rectal  SpO2: 97%       Constitutional: NAD, calm, comfortable but mildly agitated soft restraints Vitals:   04/06/2019 1811 04/09/2019 2044  BP: 112/75   Pulse: 78   Resp: 14   Temp: (!) 95.5 F (35.3 C) (!) 95.5 F (35.3 C)  TempSrc: Rectal Rectal  SpO2: 97%  Eyes: PERRL, lids and conjunctivae normal ENMT: Mucous membranes are moist. Posterior pharynx clear of any exudate or lesions.Normal dentition.  Neck: normal, supple, no masses, no thyromegaly Respiratory: clear to auscultation bilaterally, no wheezing, no crackles. Normal respiratory effort. No accessory muscle use.  Cardiovascular: Regular rate and rhythm, no murmurs / rubs / gallops. No extremity edema. 2+ pedal pulses. No carotid bruits.  Abdomen: no tenderness, no masses palpated. No hepatosplenomegaly. Bowel sounds positive.  Musculoskeletal: no clubbing / cyanosis. No joint deformity upper and lower extremities. Good ROM, no contractures. Normal muscle tone.  Skin:  no rashes, lesions, ulcers. No induration Neurologic: CN 2-12 grossly intact. Sensation intact, DTR normal. Strength 5/5 in all 4.  Psychiatric: Not normal judgment and insight. Alert and oriented x 0.  Confused and agitated   Labs on Admission: I have personally reviewed following labs and imaging studies  CBC: Recent Labs  Lab 04/16/2019 1838  WBC 16.5*  NEUTROABS 15.3*  HGB 20.4*  HCT 65.4*  MCV 99.4  PLT [id number]   Basic Metabolic Panel: Recent Labs  Lab 04/15/2019 1838  NA 157*  K 4.6  CL 116*  CO2 27  GLUCOSE 121*  BUN 98*  CREATININE 1.81*  CALCIUM 9.6   GFR: CrCl cannot be calculated (Unknown ideal weight.). Liver Function Tests: Recent Labs  Lab 04/04/2019 1838  AST 57*  ALT 95*  ALKPHOS 80  BILITOT 0.9  PROT 7.7  ALBUMIN 4.0   No results for input(s): LIPASE, AMYLASE in the last 168 hours. No results for input(s): AMMONIA in the last 168 hours. Coagulation Profile: Recent Labs  Lab 04/11/2019 1838  INR 1.0   Cardiac Enzymes: No results for input(s): CKTOTAL, CKMB, CKMBINDEX, TROPONINI in the last 168 hours. BNP (last 3 results) No results for input(s): PROBNP in the last 8760 hours. HbA1C: No results for input(s): HGBA1C in the last 72 hours. CBG: No results for input(s): GLUCAP in the last 168 hours. Lipid Profile: No results for input(s): CHOL, HDL, LDLCALC, TRIG, CHOLHDL, LDLDIRECT in the last 72 hours. Thyroid Function Tests: No results for input(s): TSH, T4TOTAL, FREET4, T3FREE, THYROIDAB in the last 72 hours. Anemia Panel: No results for input(s): VITAMINB12, FOLATE, FERRITIN, TIBC, IRON, RETICCTPCT in the last 72 hours. Urine analysis:    Component Value Date/Time   COLORURINE YELLOW 04/17/2019 1838   APPEARANCEUR CLEAR 04/13/2019 1838   LABSPEC 1.011 04/16/2019 1838   PHURINE 5.0 04/09/2019 1838   GLUCOSEU NEGATIVE 04/04/2019 1838   HGBUR NEGATIVE 04/24/2019 1838   BILIRUBINUR NEGATIVE 04/11/2019 1838   Benjamin Stain NEGATIVE 04/15/2019  1838   PROTEINUR NEGATIVE 04/13/2019 1838   NITRITE NEGATIVE 03/30/2019 1838   LEUKOCYTESUR NEGATIVE 04/20/2019 1838   Sepsis Labs: !!!!!!!!!!!!!!!!!!!!!!!!!!!!!!!!!!!!!!!!!!!! @LABRCNTIP (procalcitonin:4,lacticidven:4) )No results found for this or any previous visit (from the past 240 hour(s)).   Radiological Exams on Admission: CT Abdomen Pelvis Wo Contrast  Result Date: 04/19/2019 CLINICAL DATA:  Weakness. EXAM: CT ABDOMEN AND PELVIS WITHOUT CONTRAST TECHNIQUE: Multidetector CT imaging of the abdomen and pelvis was performed following the standard protocol without IV contrast. COMPARISON:  None. FINDINGS: Lower chest: No acute abnormality. Scarring/atelectasis at the right lung base. Hepatobiliary: No focal liver abnormality. Status post cholecystectomy. Moderate intra and extrahepatic biliary dilatation. Pancreas: Atrophic. No ductal dilatation or surrounding inflammatory changes. Spleen: Normal in size without focal abnormality. Adrenals/Urinary Tract: The adrenal glands are unremarkable. Left greater than right renal atrophy. 1.4 cm hyperdense lesion in the upper pole of the left kidney likely a proteinaceous or hemorrhagic  cyst. No renal calculi or hydronephrosis. Moderately distended bladder without wall thickening. Stomach/Bowel: Stomach is within normal limits. Appendix appears normal. No evidence of bowel wall thickening, distention, or inflammatory changes. Left-sided colonic diverticulosis. Vascular/Lymphatic: Aortic atherosclerosis. No enlarged abdominal or pelvic lymph nodes. Reproductive: Uterus and bilateral adnexa are unremarkable. Other: No abdominal wall hernia or abnormality. No abdominopelvic ascites. No pneumoperitoneum. Musculoskeletal: No acute or significant osseous findings. IMPRESSION: 1.  No acute intra-abdominal process. 2. Moderate intra and extrahepatic biliary dilatation may be related to post cholecystectomy state. Correlate with LFTs. 3. Moderately distended bladder.   Correlate for urinary retention. 4.  Aortic atherosclerosis (ICD10-I70.0). Electronically Signed   By: Titus Dubin M.D.   On: 04/07/2019 19:28   CT Head Wo Contrast  Result Date: 04/16/2019 CLINICAL DATA:  Altered mental status EXAM: CT HEAD WITHOUT CONTRAST TECHNIQUE: Contiguous axial images were obtained from the base of the skull through the vertex without intravenous contrast. COMPARISON:  None. FINDINGS: Brain: No evidence of acute infarction, hemorrhage, extra-axial collection, ventriculomegaly, or mass effect. 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 Mild encephalomalacia of the right temporal lobe. Generalized cerebral atrophy. Periventricular white matter low attenuation likely secondary to microangiopathy. Vascular: Cerebrovascular atherosclerotic calcifications are noted. Skull: Negative for fracture or focal lesion. Sinuses/Orbits: Visualized portions of the orbits are unremarkable. Visualized portions of the paranasal sinuses are unremarkable. Visualized portions of the mastoid air cells are unremarkable. Other: None. IMPRESSION: 1.   1.  No acute intracranial pathology. 2. Chronic microvascular disease and cerebral atrophy. 3. 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 Mild encephalomalacia of the right temporal lobe. Electronically Signed   By: Kathreen Devoid   On: 04/10/2019 19:24   DG Chest Port 1 View  Result Date: 03/30/2019 CLINICAL DATA:  Acute altered mental status and weakness. EXAM: PORTABLE CHEST 1 VIEW COMPARISON:  01/21/2018 FINDINGS: Stable mild cardiomegaly. Aortic atherosclerosis incidentally noted. Mild scarring is seen in the right lung base. No evidence of pulmonary infiltrate or edema. No evidence of pneumothorax or pleural effusion. IMPRESSION: Mild cardiomegaly.  No active lung disease. Electronically Signed   By: Marlaine Hind M.D.   On: 04/14/2019 19:12    Case discussed with EDP Old chart reviewed Chest  x-ray reviewed no edema or infiltrate  Assessment/Plan 85 year old female with a history dementia sent in for metabolic encephalopathy found to be hypothermic with elevated white count and acute kidney injury presumptive sepsis of unclear source  Principal Problem:   Sepsis (HCC)-leukocytosis with hypothermia blood pressure stable.  Blood cultures pending.  UA is pending.  Chest x-ray is negative.  Lactic acid mildly elevated.  Serial lactic acid until normalizes.  Check procalcitonin level.  Placed on vancomycin and cefepime.  De-escalate antibiotics in the next 24 to 48 hours if source clear and improved.  Covid screen is pending.  Continue Bair hugger in stepdown unit.  Active Problems:   Acute metabolic encephalopathy-secondary to hypothermia and sepsis    AKI (acute kidney injury) (HCC)-multifactorial likely secondary to dehydration and sepsis.  Gentle IV fluids overnight and repeat creatinine in the morning.    Dementia (HCC)-noted    Hypernatremia-due to dehydration gentle IV fluids normal saline overnight    Dehydration-as above  Clarify CODE STATUS with nursing home     DVT prophylaxis: Lovenox Code Status: Presumptive full code Family Communication: None Disposition Plan: Days Consults called: None Admission status: Admission   Abbigale Mcelhaney A MD [facility]  If 7PM-7AM, please contact night-coverage [email or web address] Password [email or web address]  04/11/2019, 9:02 PM

## 2019-04-25 NOTE — ED Triage Notes (Signed)
Pt arrives from [facility] with complaints of increased weakness and acute altered mental status. Pt has hx of dementia. EMS: reports that the pt typically is active and yesterday has had a decline. Pt had COVID in December. Pt has had a negative COVID since.

## 2019-04-26 ENCOUNTER — Other Ambulatory Visit: Payer: Self-pay

## 2019-04-26 ENCOUNTER — Encounter (HOSPITAL_COMMUNITY): Payer: Self-pay | Admitting: Family Medicine

## 2019-04-26 DIAGNOSIS — E860 Dehydration: Secondary | ICD-10-CM

## 2019-04-26 DIAGNOSIS — G9341 Metabolic encephalopathy: Secondary | ICD-10-CM

## 2019-04-26 DIAGNOSIS — E870 Hyperosmolality and hypernatremia: Secondary | ICD-10-CM

## 2019-04-26 DIAGNOSIS — G309 Alzheimer's disease, unspecified: Secondary | ICD-10-CM

## 2019-04-26 DIAGNOSIS — N179 Acute kidney failure, unspecified: Secondary | ICD-10-CM

## 2019-04-26 DIAGNOSIS — F0280 Dementia in other diseases classified elsewhere without behavioral disturbance: Secondary | ICD-10-CM

## 2019-04-26 LAB — COMPREHENSIVE METABOLIC PANEL
ALT: 59 U/L — ABNORMAL HIGH (ref 0–44)
AST: 30 U/L (ref 15–41)
Albumin: 2.9 g/dL — ABNORMAL LOW (ref 3.5–5.0)
Alkaline Phosphatase: 54 U/L (ref 38–126)
Anion gap: 11 (ref 5–15)
BUN: 92 mg/dL — ABNORMAL HIGH (ref 8–23)
CO2: 22 mmol/L (ref 22–32)
Calcium: 8.2 mg/dL — ABNORMAL LOW (ref 8.9–10.3)
Chloride: 124 mmol/L — ABNORMAL HIGH (ref 98–111)
Creatinine, Ser: 1.66 mg/dL — ABNORMAL HIGH (ref 0.44–1.00)
GFR calc Af Amer: 30 mL/min — ABNORMAL LOW (ref >60)
GFR calc non Af Amer: 26 mL/min — ABNORMAL LOW (ref >60)
Glucose, Bld: 114 mg/dL — ABNORMAL HIGH (ref 70–99)
Potassium: 4 mmol/L (ref 3.5–5.1)
Sodium: 157 mmol/L — ABNORMAL HIGH (ref 135–145)
Total Bilirubin: 0.9 mg/dL (ref 0.3–1.2)
Total Protein: 5.5 g/dL — ABNORMAL LOW (ref 6.5–8.1)

## 2019-04-26 LAB — CREATININE, SERUM
Creatinine, Ser: 1.87 mg/dL — ABNORMAL HIGH (ref 0.44–1.00)
GFR calc Af Amer: 26 mL/min — ABNORMAL LOW (ref >60)
GFR calc non Af Amer: 22 mL/min — ABNORMAL LOW (ref >60)

## 2019-04-26 LAB — CBC WITH DIFFERENTIAL/PLATELET
Abs Immature Granulocytes: 0.11 10*3/uL — ABNORMAL HIGH (ref 0.00–0.07)
Basophils Absolute: 0 10*3/uL (ref 0.0–0.1)
Basophils Relative: 0 %
Eosinophils Absolute: 0 10*3/uL (ref 0.0–0.5)
Eosinophils Relative: 0 %
HCT: 50.6 % — ABNORMAL HIGH (ref 36.0–46.0)
Hemoglobin: 15.5 g/dL — ABNORMAL HIGH (ref 12.0–15.0)
Immature Granulocytes: 1 %
Lymphocytes Relative: 4 %
Lymphs Abs: 0.6 10*3/uL — ABNORMAL LOW (ref 0.7–4.0)
MCH: 31.2 pg (ref 26.0–34.0)
MCHC: 30.6 g/dL (ref 30.0–36.0)
MCV: 101.8 fL — ABNORMAL HIGH (ref 80.0–100.0)
Monocytes Absolute: 0.9 10*3/uL (ref 0.1–1.0)
Monocytes Relative: 6 %
Neutro Abs: 14.6 10*3/uL — ABNORMAL HIGH (ref 1.7–7.7)
Neutrophils Relative %: 89 %
Platelets: 144 10*3/uL — ABNORMAL LOW (ref 150–400)
RBC: 4.97 MIL/uL (ref 3.87–5.11)
RDW: 14.3 % (ref 11.5–15.5)
WBC: 16.3 10*3/uL — ABNORMAL HIGH (ref 4.0–10.5)
nRBC: 0 % (ref 0.0–0.2)

## 2019-04-26 LAB — CBC
HCT: 59.2 % — ABNORMAL HIGH (ref 36.0–46.0)
Hemoglobin: 18.6 g/dL — ABNORMAL HIGH (ref 12.0–15.0)
MCH: 31.3 pg (ref 26.0–34.0)
MCHC: 31.4 g/dL (ref 30.0–36.0)
MCV: 99.5 fL (ref 80.0–100.0)
Platelets: 174 10*3/uL (ref 150–400)
RBC: 5.95 MIL/uL — ABNORMAL HIGH (ref 3.87–5.11)
RDW: 14.2 % (ref 11.5–15.5)
WBC: 17.7 10*3/uL — ABNORMAL HIGH (ref 4.0–10.5)
nRBC: 0 % (ref 0.0–0.2)

## 2019-04-26 LAB — URINE CULTURE: Culture: NO GROWTH

## 2019-04-26 LAB — BASIC METABOLIC PANEL
Anion gap: 15 (ref 5–15)
BUN: 85 mg/dL — ABNORMAL HIGH (ref 8–23)
CO2: 23 mmol/L (ref 22–32)
Calcium: 8.5 mg/dL — ABNORMAL LOW (ref 8.9–10.3)
Chloride: 121 mmol/L — ABNORMAL HIGH (ref 98–111)
Creatinine, Ser: 1.7 mg/dL — ABNORMAL HIGH (ref 0.44–1.00)
GFR calc Af Amer: 29 mL/min — ABNORMAL LOW (ref >60)
GFR calc non Af Amer: 25 mL/min — ABNORMAL LOW (ref >60)
Glucose, Bld: 100 mg/dL — ABNORMAL HIGH (ref 70–99)
Potassium: 4.3 mmol/L (ref 3.5–5.1)
Sodium: 159 mmol/L — ABNORMAL HIGH (ref 135–145)

## 2019-04-26 LAB — MRSA PCR SCREENING: MRSA by PCR: POSITIVE — AB

## 2019-04-26 LAB — PROCALCITONIN: Procalcitonin: <0.1 ng/mL

## 2019-04-26 LAB — LACTIC ACID, PLASMA
Lactic Acid, Venous: 2.8 mmol/L (ref 0.5–1.9)
Lactic Acid, Venous: 2.1 mmol/L (ref 0.5–1.9)
Lactic Acid, Venous: 2 mmol/L (ref 0.5–1.9)

## 2019-04-26 LAB — SARS CORONAVIRUS 2 (TAT 6-24 HRS): SARS Coronavirus 2: POSITIVE — AB

## 2019-04-26 MED ORDER — HYDROCOD POLST-CPM POLST ER 10-8 MG/5ML PO SUER: 5.0000 mL | Freq: Two times a day (BID) | ORAL | Status: DC | PRN

## 2019-04-26 MED ORDER — MUPIROCIN 2 % EX OINT
1.0000 "application " | TOPICAL_OINTMENT | Freq: Two times a day (BID) | CUTANEOUS | Status: DC
  Administered 2019-04-26 – 2019-04-27 (×3): 1 via NASAL
  Filled 2019-04-26: qty 22

## 2019-04-26 MED ORDER — SODIUM CHLORIDE 0.9 % IV SOLN: 100.0000 mg | Freq: Every day | INTRAVENOUS | Status: DC

## 2019-04-26 MED ORDER — DEXTROSE 5 % IV SOLN: INTRAVENOUS | Status: DC

## 2019-04-26 MED ORDER — SODIUM CHLORIDE 0.9 % IV SOLN
200.0000 mg | Freq: Once | INTRAVENOUS | Status: DC
  Administered 2019-04-26: 200 mg via INTRAVENOUS
  Filled 2019-04-26: qty 200

## 2019-04-26 MED ORDER — SODIUM CHLORIDE 0.9 % IV SOLN
2.0000 g | INTRAVENOUS | Status: DC
  Administered 2019-04-26: 2 g via INTRAVENOUS
  Filled 2019-04-26: qty 2

## 2019-04-26 MED ORDER — ZINC SULFATE 220 (50 ZN) MG PO CAPS
220.0000 mg | ORAL_CAPSULE | Freq: Every day | ORAL | Status: DC
  Filled 2019-04-26: qty 1

## 2019-04-26 MED ORDER — GUAIFENESIN-DM 100-10 MG/5ML PO SYRP: 10.0000 mL | ORAL_SOLUTION | ORAL | Status: DC | PRN

## 2019-04-26 MED ORDER — ASCORBIC ACID 500 MG PO TABS
500.0000 mg | ORAL_TABLET | Freq: Every day | ORAL | Status: DC
  Filled 2019-04-26: qty 1

## 2019-04-26 MED ORDER — CHLORHEXIDINE GLUCONATE CLOTH 2 % EX PADS
6.0000 | MEDICATED_PAD | Freq: Every day | CUTANEOUS | Status: DC
  Administered 2019-04-26 – 2019-04-30 (×3): 6 via TOPICAL

## 2019-04-26 MED ORDER — ADULT MULTIVITAMIN W/MINERALS CH
1.0000 | ORAL_TABLET | Freq: Every day | ORAL | Status: DC
  Filled 2019-04-26: qty 1

## 2019-04-26 MED ORDER — VANCOMYCIN HCL 500 MG/100ML IV SOLN: 500.0000 mg | INTRAVENOUS | Status: DC

## 2019-04-26 MED ORDER — HEPARIN SODIUM (PORCINE) 5000 UNIT/ML IJ SOLN
5000.0000 [IU] | Freq: Three times a day (TID) | INTRAMUSCULAR | Status: DC
  Administered 2019-04-27: 5000 [IU] via SUBCUTANEOUS
  Filled 2019-04-26: qty 1

## 2019-04-26 NOTE — Progress Notes (Signed)
Lab unable to draw labs, only a lactic acid was obtained. MD made aware.

## 2019-04-26 NOTE — ED Notes (Signed)
ED TO INPATIENT HANDOFF REPORT  Name/Age/Gender Andrea Shepard 85 y.o. female  Code Status    Code Status Orders  (From admission, onward)         Start     Ordered   04/17/2019 2207  Full code  Continuous     04/24/2019 2208        Code Status History    This patient has a current code status but no historical code status.   Advance Care Planning Activity      Home/SNF/Other Nursing Home  Chief Complaint Sepsis [facility]) [A41.9]  Level of Care/Admitting Diagnosis ED Disposition    ED Disposition Condition Comment   Admit  Hospital Area: [facility] [[id number]]  Level of Care: Stepdown [14]  Admit to SDU based on following criteria: Hemodynamic compromise or significant risk of instability:  Patient requiring short term acute titration and management of vasoactive drips, and invasive monitoring (i.e., CVP and Arterial line).  Covid Evaluation: Symptomatic Person Under Investigation (PUI)  [facility]: [id number]  Admitting Physician: Phillips Grout [[id number]]  Attending Physician: Derrill Kay A [[id number]]  Estimated length of stay: 5 - 7 days  Certification:: I certify this patient will need inpatient services for at least 2 midnights       Medical History No past medical history on file.  Allergies Allergies  Allergen Reactions  . Levaquin [Levofloxacin] Other (See Comments)    unknown reaction    IV Location/Drains/Wounds Patient Lines/Drains/Airways Status   Active Line/Drains/Airways    Name:   Placement date:   Placement time:   Site:   Days:   Peripheral IV 04/18/2019 Left Antecubital   04/11/2019    --    Antecubital   1   Peripheral IV 04/09/2019 Right Antecubital   04/15/2019    1846    Antecubital   1   Urethral Catheter Airyonna Franklyn B Latex;Double-lumen 14 Fr.   04/04/2019    2045    Latex;Double-lumen   1          Labs/Imaging Results for orders placed or performed during the hospital encounter of 04/16/2019 (from the past 48 hour(s))   Lactic acid, plasma     Status: Abnormal   Collection Time: 04/11/2019  6:38 PM  Result Value Ref Range   Lactic Acid, Venous 2.3 (HH) 0.5 - 1.9 mmol/L    Comment: CRITICAL RESULT CALLED TO, READ BACK BY AND VERIFIED WITH: K.WICKER AT 2000 ON 04/08/2019 BY N.THOMPSON Performed at [facility], [place] [address] [place], [place] [address]   Comprehensive metabolic panel     Status: Abnormal   Collection Time: 03/31/2019  6:38 PM  Result Value Ref Range   Sodium 157 (H) 135 - 145 mmol/L   Potassium 4.6 3.5 - 5.1 mmol/L   Chloride 116 (H) 98 - 111 mmol/L   CO2 27 22 - 32 mmol/L   Glucose, Bld 121 (H) 70 - 99 mg/dL   BUN 98 (H) 8 - 23 mg/dL   Creatinine, Ser 1.81 (H) 0.44 - 1.00 mg/dL   Calcium 9.6 8.9 - 10.3 mg/dL   Total Protein 7.7 6.5 - 8.1 g/dL   Albumin 4.0 3.5 - 5.0 g/dL   AST 57 (H) 15 - 41 U/L   ALT 95 (H) 0 - 44 U/L   Alkaline Phosphatase 80 38 - 126 U/L   Total Bilirubin 0.9 0.3 - 1.2 mg/dL   GFR calc non Af Amer 23 (L) >60 mL/min  GFR calc Af Amer 27 (L) >60 mL/min   Anion gap 14 5 - 15    Comment: Performed at [facility], [place] [address] [place], [place] [address]  CBC WITH DIFFERENTIAL     Status: Abnormal   Collection Time: 04/05/2019  6:38 PM  Result Value Ref Range   WBC 16.5 (H) 4.0 - 10.5 K/uL   RBC 6.58 (H) 3.87 - 5.11 MIL/uL   Hemoglobin 20.4 (H) 12.0 - 15.0 g/dL   HCT 65.4 (H) 36.0 - 46.0 %   MCV 99.4 80.0 - 100.0 fL   MCH 31.0 26.0 - 34.0 pg   MCHC 31.2 30.0 - 36.0 g/dL   RDW 14.8 11.5 - 15.5 %   Platelets 166 150 - 400 K/uL    Comment: REPEATED TO VERIFY PLATELET COUNT CONFIRMED BY SMEAR    nRBC 0.0 0.0 - 0.2 %   Neutrophils Relative % 93 %   Neutro Abs 15.3 (H) 1.7 - 7.7 K/uL   Lymphocytes Relative 3 %   Lymphs Abs 0.6 (L) 0.7 - 4.0 K/uL   Monocytes Relative 3 %   Monocytes Absolute 0.5 0.1 - 1.0 K/uL   Eosinophils Relative 0 %   Eosinophils Absolute 0.0 0.0 - 0.5 K/uL   Basophils Relative 0 %   Basophils  Absolute 0.0 0.0 - 0.1 K/uL   Immature Granulocytes 1 %   Abs Immature Granulocytes 0.12 (H) 0.00 - 0.07 K/uL    Comment: Performed at [facility], [place] [address], [place], [place] [address]  APTT     Status: None   Collection Time: 04/24/2019  6:38 PM  Result Value Ref Range   aPTT 28 24 - 36 seconds    Comment: Performed at [facility], [place] [address] [place], [place] [address]  Protime-INR     Status: None   Collection Time: 03/29/2019  6:38 PM  Result Value Ref Range   Prothrombin Time 13.0 11.4 - 15.2 seconds   INR 1.0 0.8 - 1.2    Comment: (NOTE) INR goal varies based on device and disease states. Performed at [facility], [place] [address] [place], [place] [address]   Urinalysis, Routine w reflex microscopic     Status: None   Collection Time: 03/28/2019  6:38 PM  Result Value Ref Range   Color, Urine YELLOW YELLOW   APPearance CLEAR CLEAR   Specific Gravity, Urine 1.011 1.005 - 1.030   pH 5.0 5.0 - 8.0   Glucose, UA NEGATIVE NEGATIVE mg/dL   Hgb urine dipstick NEGATIVE NEGATIVE   Bilirubin Urine NEGATIVE NEGATIVE   Ketones, ur NEGATIVE NEGATIVE mg/dL   Protein, ur NEGATIVE NEGATIVE mg/dL   Nitrite NEGATIVE NEGATIVE   Leukocytes,Ua NEGATIVE NEGATIVE    Comment: Performed at [facility] [address] [place], [place] [address]  Digoxin level     Status: None   Collection Time: 04/02/2019  6:38 PM  Result Value Ref Range   Digoxin Level 1.7 0.8 - 2.0 ng/mL    Comment: Performed at [facility], [place] [address] [place],  [address]  Procalcitonin - Baseline     Status: None   Collection Time: 04/06/2019 10:06 PM  Result Value Ref Range   Procalcitonin <0.10 ng/mL    Comment:        Interpretation: PCT (Procalcitonin) <= 0.5 ng/mL: Systemic infection (sepsis) is not likely. Local bacterial infection is possible. (NOTE)  Sepsis PCT Algorithm           Lower  Respiratory Tract                                      Infection PCT Algorithm    ----------------------------     ----------------------------         PCT < 0.25 ng/mL                PCT < 0.10 ng/mL         Strongly encourage             Strongly discourage   discontinuation of antibiotics    initiation of antibiotics    ----------------------------     -----------------------------       PCT 0.25 - 0.50 ng/mL            PCT 0.10 - 0.25 ng/mL               OR       >80% decrease in PCT            Discourage initiation of                                            antibiotics      Encourage discontinuation           of antibiotics    ----------------------------     -----------------------------         PCT >= 0.50 ng/mL              PCT 0.26 - 0.50 ng/mL               AND        <80% decrease in PCT             Encourage initiation of                                             antibiotics       Encourage continuation           of antibiotics    ----------------------------     -----------------------------        PCT >= 0.50 ng/mL                  PCT > 0.50 ng/mL               AND         increase in PCT                  Strongly encourage                                      initiation of antibiotics    Strongly encourage escalation           of antibiotics                                     -----------------------------  PCT <= 0.25 ng/mL                                                 OR                                        > 80% decrease in PCT                                     Discontinue / Do not initiate                                             antibiotics Performed at [facility] [address] [place], [place] [address]   CBC     Status: Abnormal   Collection Time: 04/01/2019 10:06 PM  Result Value Ref Range   WBC 17.7 (H) 4.0 - 10.5 K/uL   RBC 5.95 (H) 3.87 - 5.11 MIL/uL   Hemoglobin 18.6 (H)  12.0 - 15.0 g/dL   HCT 59.2 (H) 36.0 - 46.0 %   MCV 99.5 80.0 - 100.0 fL   MCH 31.3 26.0 - 34.0 pg   MCHC 31.4 30.0 - 36.0 g/dL   RDW 14.2 11.5 - 15.5 %   Platelets 174 150 - 400 K/uL    Comment: REPEATED TO VERIFY PLATELET COUNT CONFIRMED BY SMEAR SPECIMEN CHECKED FOR CLOTS    nRBC 0.0 0.0 - 0.2 %    Comment: Performed at [facility], [place] [address], [place], [place] [address]  Creatinine, serum     Status: Abnormal   Collection Time: 03/30/2019 10:06 PM  Result Value Ref Range   Creatinine, Ser 1.87 (H) 0.44 - 1.00 mg/dL   GFR calc non Af Amer 22 (L) >60 mL/min   GFR calc Af Amer 26 (L) >60 mL/min    Comment: Performed at [facility], [place] [address], [place], [place] [address]  Lactic acid, plasma     Status: Abnormal   Collection Time: 04/23/2019 11:23 PM  Result Value Ref Range   Lactic Acid, Venous 2.8 (HH) 0.5 - 1.9 mmol/L    Comment: CRITICAL VALUE NOTED.  VALUE IS CONSISTENT WITH PREVIOUSLY REPORTED AND CALLED VALUE. Performed at [facility], [place] [address], [place], [place] [address]    CT Abdomen Pelvis Wo Contrast  Result Date: 03/28/2019 CLINICAL DATA:  Weakness. EXAM: CT ABDOMEN AND PELVIS WITHOUT CONTRAST TECHNIQUE: Multidetector CT imaging of the abdomen and pelvis was performed following the standard protocol without IV contrast. COMPARISON:  None. FINDINGS: Lower chest: No acute abnormality. Scarring/atelectasis at the right lung base. Hepatobiliary: No focal liver abnormality. Status post cholecystectomy. Moderate intra and extrahepatic biliary dilatation. Pancreas: Atrophic. No ductal dilatation or surrounding inflammatory changes. Spleen: Normal in size without focal abnormality. Adrenals/Urinary Tract: The adrenal glands are unremarkable. Left greater than right renal atrophy. 1.4 cm hyperdense lesion in the upper pole of the left kidney likely a proteinaceous or hemorrhagic cyst. No renal calculi or  hydronephrosis. Moderately distended bladder without wall thickening. Stomach/Bowel: Stomach is  within normal limits. Appendix appears normal. No evidence of bowel wall thickening, distention, or inflammatory changes. Left-sided colonic diverticulosis. Vascular/Lymphatic: Aortic atherosclerosis. No enlarged abdominal or pelvic lymph nodes. Reproductive: Uterus and bilateral adnexa are unremarkable. Other: No abdominal wall hernia or abnormality. No abdominopelvic ascites. No pneumoperitoneum. Musculoskeletal: No acute or significant osseous findings. IMPRESSION: 1.  No acute intra-abdominal process. 2. Moderate intra and extrahepatic biliary dilatation may be related to post cholecystectomy state. Correlate with LFTs. 3. Moderately distended bladder.  Correlate for urinary retention. 4.  Aortic atherosclerosis (ICD10-I70.0). Electronically Signed   By: Titus Dubin M.D.   On: 03/29/2019 19:28   CT Head Wo Contrast  Result Date: 04/23/2019 CLINICAL DATA:  Altered mental status EXAM: CT HEAD WITHOUT CONTRAST TECHNIQUE: Contiguous axial images were obtained from the base of the skull through the vertex without intravenous contrast. COMPARISON:  None. FINDINGS: Brain: No evidence of acute infarction, hemorrhage, extra-axial collection, ventriculomegaly, or mass effect. 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 Mild encephalomalacia of the right temporal lobe. Generalized cerebral atrophy. Periventricular white matter low attenuation likely secondary to microangiopathy. Vascular: Cerebrovascular atherosclerotic calcifications are noted. Skull: Negative for fracture or focal lesion. Sinuses/Orbits: Visualized portions of the orbits are unremarkable. Visualized portions of the paranasal sinuses are unremarkable. Visualized portions of the mastoid air cells are unremarkable. Other: None. IMPRESSION: 1.   1.  No acute intracranial pathology. 2. Chronic microvascular disease and cerebral  atrophy. 3. 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 Mild encephalomalacia of the right temporal lobe. Electronically Signed   By: Kathreen Devoid   On: 04/01/2019 19:24   DG Chest Port 1 View  Result Date: 04/05/2019 CLINICAL DATA:  Acute altered mental status and weakness. EXAM: PORTABLE CHEST 1 VIEW COMPARISON:  01/21/2018 FINDINGS: Stable mild cardiomegaly. Aortic atherosclerosis incidentally noted. Mild scarring is seen in the right lung base. No evidence of pulmonary infiltrate or edema. No evidence of pneumothorax or pleural effusion. IMPRESSION: Mild cardiomegaly.  No active lung disease. Electronically Signed   By: Marlaine Hind M.D.   On: 04/12/2019 19:12    Pending Labs Unresulted Labs (From admission, onward)    Start     Ordered   05/02/19 0500  Creatinine, serum  (enoxaparin (LOVENOX)    CrCl < 30 ml/min)  Weekly,   R    Comments: while on enoxaparin therapy.    04/24/2019 2208   04/26/19 [id number]  Basic metabolic panel  Tomorrow morning,   R     04/16/2019 2208   04/26/19 0500  CBC  Tomorrow morning,   R     04/05/2019 2208   04/12/2019 2206  Lactic acid, plasma  STAT Now then every 3 hours,   R (with STAT occurrences)     03/28/2019 2205   04/19/2019 2023  SARS CORONAVIRUS 2 (TAT 6-24 HRS) Nasopharyngeal  (Tier 3 (TAT 6-24 hrs))  Once,   STAT    Question Answer Comment  Is this test for diagnosis or screening Screening   Symptomatic for COVID-19 as defined by CDC No   Hospitalized for COVID-19 No   Admitted to ICU for COVID-19 No   Previously tested for COVID-19 No   Resident in a congregate (group) care setting Yes   Employed in healthcare setting No   Pregnant No      03/29/2019 2022   04/05/2019 1816  Lactic acid, plasma  Now then every 2 hours,   STAT     04/27/2019 1816   04/17/2019 1816  Blood Culture (  routine x 2)  BLOOD CULTURE X 2,   STAT     04/01/2019 1816   04/22/2019 1816  Urine culture  ONCE - STAT,   STAT     04/02/2019 1816           Vitals/Pain Today's Vitals   04/12/2019 2130 03/29/2019 2300 03/29/2019 2330 04/22/2019 2340  BP: 119/84 126/82 124/80   Pulse: 90  85 88  Resp: 17 (!) 21 19 17   Temp:    (!) 97.5 F (36.4 C)  TempSrc:    Oral  SpO2: 96%  94%     Isolation Precautions No active isolations  Medications Medications  enoxaparin (LOVENOX) injection 30 mg (has no administration in time range)  0.9 %  sodium chloride infusion (has no administration in time range)  acetaminophen (TYLENOL) tablet 650 mg (has no administration in time range)    Or  acetaminophen (TYLENOL) suppository 650 mg (has no administration in time range)  ondansetron (ZOFRAN) tablet 4 mg (has no administration in time range)    Or  ondansetron (ZOFRAN) injection 4 mg (has no administration in time range)  LORazepam (ATIVAN) injection 0.5 mg (has no administration in time range)  ceFEPIme (MAXIPIME) 2 g in sodium chloride 0.9 % 100 mL IVPB (0 g Intravenous Stopped 04/15/2019 2212)  metroNIDAZOLE (FLAGYL) IVPB 500 mg (0 mg Intravenous Stopped 04/24/2019 2212)  vancomycin (VANCOCIN) IVPB 1000 mg/200 mL premix (0 mg Intravenous Stopped 04/24/2019 2314)  sodium chloride 0.9 % bolus 1,000 mL (0 mLs Intravenous Stopped 04/09/2019 2255)    Mobility non-ambulatory

## 2019-04-26 NOTE — Progress Notes (Addendum)
PROGRESS NOTE  Andrea Shepard [id number] DOB: May 26, 1919 DOA: 04/18/2019 PCP: [name], MD  HPI/Recap of past 24 hours: HPI from Dr Andrea Shepard is a 85 y.o. female with medical history significant of dementia from nursing home sent in because of altered mental status and not acting her normal.  At patient's baseline she is ambulatory and interactive she had a marked decline in the last 24 hours. No history can be obtained from patient due to her dementia.  On arrival her temperature was found to be 95.5 with an elevated white count.  Patient thought to be septic of unclear source.  Of note, pt tested positive for Covid about 2 weeks ago per her daughter. She is been placed on a bear hugger and referred for admission for likely sepsis probably from UTI.    Today, unable to perform ROS, noted to be agitated.  Noted to have pauses on telemetry, about 2 to 3 secs. alert, awake, not oriented.     Assessment/Plan: Principal Problem:   Sepsis ([place]) Active Problems:   Dementia ([place])   Acute metabolic encephalopathy   AKI (acute kidney injury) ([place])   Hypernatremia   Dehydration   Acute metabolic encephalopathy/dehydration/failure to thrive likely 2/2 Covid infection with a history of dementia Patient noted to be very dry Patient tested positive for Covid about 2 weeks ago in memory care unit, repeat this admission is positive IV fluids Palliative/hospice consulted  Hypernatremia Sodium 157 on admission Likely due to above Continue IV fluids D5 water Frequent BMP  ??Sepsis POA Vs dehydration Unknown etiology, may be just dehydration causing elevated lactic acid, WBC Noted to be hypothermic on presentation, improving, with leukocytosis Procalcitonin negative BC x2 pending UA negative, UC pending Continue IV cefepime for now, plan to de-escalate once urine culture back Bear-Hugger for hypothermia prn  AKI on ??CKD stage IV Last baseline creatinine in 2019 was  1.47 Improving status post hydration Continue IV fluids  Elevated hemoglobin May be due to hemoconcentration from dehydration Daily CBC  Known COVID-19 infection Currently with no symptoms, saturating well on room air Chest x-ray unremarkable Inflammatory markers pending Monitor closely, no need for remdesivir or steroids  Dementia Delirium precautions  Goals of care Patient DNR on presentation Spoke to daughter on 04/26/2019, agreed for possible hospice after 24 hours of IV fluids and antibiotics Palliative consulted          Malnutrition Type:  Nutrition Problem: Inadequate oral intake Etiology: acute illness, lethargy/confusion   Malnutrition Characteristics:  Signs/Symptoms: meal completion < 25%, other (comment)(RN report)   Nutrition Interventions:  Interventions: Refer to RD note for recommendations    Estimated body mass index is 18 kg/m as calculated from the following:   Height as of this encounter: 5\' 3"  (1.6 m).   Weight as of this encounter: 46.1 kg.     Code Status: DNR  Family Communication: Spoke to daughter on 04/26/2019, agreed to transition to hospice after 24 hours of IV fluids, antibiotics  Disposition Plan: Likely residential hospice   Consultants:  None  Procedures:  None  Antimicrobials:  Cefepime  DVT prophylaxis: Heparin   Objective: Vitals:   04/26/19 1100 04/26/19 1200 04/26/19 1247 04/26/19 1250  BP: 132/67 126/68 136/62   Pulse: 72 77 (!) 34 (!) 44  Resp: 15 15 (!) 4 (!) 6  Temp:    99 F (37.2 C)  TempSrc:    Axillary  SpO2: 92% 100% 100% 100%  Weight:  Height:        Intake/Output Summary (Last 24 hours) at 04/26/2019 1302 Last data filed at 04/26/2019 1100 Gross per 24 hour  Intake 499.96 ml  Output 350 ml  Net 149.96 ml   Filed Weights   04/26/19 0235  Weight: 46.1 kg    Exam:  General: NAD, restless, alert, awake, not oriented, currently nonverbal  Cardiovascular: S1, S2  present  Respiratory: CTAB  Abdomen: Soft, nontender, nondistended, bowel sounds present  Musculoskeletal: No bilateral pedal edema noted  Skin: Normal  Psychiatry:  Unable to assess    Data Reviewed: CBC: Recent Labs  Lab 03/31/2019 1838 04/14/2019 2206 04/26/19 0649  WBC 16.5* 17.7* 16.3*  NEUTROABS 15.3*  --  14.6*  HGB 20.4* 18.6* 15.5*  HCT 65.4* 59.2* 50.6*  MCV 99.4 99.5 101.8*  PLT 166 174 123456*   Basic Metabolic Panel: Recent Labs  Lab 04/10/2019 1838 04/22/2019 2206 04/26/19 0649  NA 157*  --  157*  K 4.6  --  4.0  CL 116*  --  124*  CO2 27  --  22  GLUCOSE 121*  --  114*  BUN 98*  --  92*  CREATININE 1.81* 1.87* 1.66*  CALCIUM 9.6  --  8.2*   GFR: Estimated Creatinine Clearance: 14.1 mL/min (A) (by C-G formula based on SCr of 1.66 mg/dL (H)). Liver Function Tests: Recent Labs  Lab 04/26/2019 1838 04/26/19 0649  AST 57* 30  ALT 95* 59*  ALKPHOS 80 54  BILITOT 0.9 0.9  PROT 7.7 5.5*  ALBUMIN 4.0 2.9*   No results for input(s): LIPASE, AMYLASE in the last 168 hours. No results for input(s): AMMONIA in the last 168 hours. Coagulation Profile: Recent Labs  Lab 04/09/2019 1838  INR 1.0   Cardiac Enzymes: No results for input(s): CKTOTAL, CKMB, CKMBINDEX, TROPONINI in the last 168 hours. BNP (last 3 results) No results for input(s): PROBNP in the last 8760 hours. HbA1C: No results for input(s): HGBA1C in the last 72 hours. CBG: No results for input(s): GLUCAP in the last 168 hours. Lipid Profile: No results for input(s): CHOL, HDL, LDLCALC, TRIG, CHOLHDL, LDLDIRECT in the last 72 hours. Thyroid Function Tests: No results for input(s): TSH, T4TOTAL, FREET4, T3FREE, THYROIDAB in the last 72 hours. Anemia Panel: No results for input(s): VITAMINB12, FOLATE, FERRITIN, TIBC, IRON, RETICCTPCT in the last 72 hours. Urine analysis:    Component Value Date/Time   COLORURINE YELLOW 03/30/2019 1838   APPEARANCEUR CLEAR 03/30/2019 1838   LABSPEC 1.011  04/06/2019 1838   PHURINE 5.0 04/22/2019 1838   GLUCOSEU NEGATIVE 04/07/2019 1838   HGBUR NEGATIVE 04/05/2019 1838   BILIRUBINUR NEGATIVE 04/01/2019 1838   Benjamin Stain NEGATIVE 03/30/2019 1838   PROTEINUR NEGATIVE 04/21/2019 1838   NITRITE NEGATIVE 04/24/2019 1838   LEUKOCYTESUR NEGATIVE 04/15/2019 1838   Sepsis Labs: @LABRCNTIP (procalcitonin:4,lacticidven:4)  ) Recent Results (from the past 240 hour(s))  Blood Culture (routine x 2)     Status: None (Preliminary result)   Collection Time: 04/07/2019  6:38 PM   Specimen: BLOOD  Result Value Ref Range Status   Specimen Description   Final    BLOOD RIGHT ANTECUBITAL Performed at [facility], [place] [address] [place], [place] [address]    Special Requests   Final    BOTTLES DRAWN AEROBIC AND ANAEROBIC Blood Culture results may not be optimal due to an inadequate volume of blood received in culture bottles Performed at [facility] [address], [place], [place] [address]  Culture   Final    NO GROWTH < 12 HOURS Performed at [facility], [place] [address], [place], [place] [address]    Report Status PENDING  Incomplete  Blood Culture (routine x 2)     Status: None (Preliminary result)   Collection Time: 04/03/2019  6:38 PM   Specimen: BLOOD  Result Value Ref Range Status   Specimen Description   Final    BLOOD RIGHT ANTECUBITAL Performed at [place] [address], [place], [place] [address]    Special Requests   Final    BOTTLES DRAWN AEROBIC AND ANAEROBIC Blood Culture results may not be optimal due to an inadequate volume of blood received in culture bottles Performed at [facility] [address], [place], [place] [address]    Culture   Final    NO GROWTH < 12 HOURS Performed at [facility] [address], [place], [place] [address]    Report Status PENDING  Incomplete  SARS CORONAVIRUS 2 (TAT 6-24 HRS) Nasopharyngeal      Status: Abnormal   Collection Time: 03/29/2019  8:23 PM   Specimen: Nasopharyngeal  Result Value Ref Range Status   SARS Coronavirus 2 POSITIVE (A) NEGATIVE Final    Comment: RESULT CALLED TO, READ BACK BY AND VERIFIED WITH: Sharyn Creamer RN 11:50 04/26/19 ([email or web address]) (NOTE) SARS-CoV-2 target nucleic acids are NOT DETECTED. The SARS-CoV-2 RNA is generally detectable in upper and lower respiratory specimens during the acute phase of infection. Negative results do not preclude SARS-CoV-2 infection, do not rule out co-infections with other pathogens, and should not be used as the sole basis for treatment or other patient management decisions. Negative results must be combined with clinical observations, patient history, and epidemiological information. The expected result is Negative. Fact Sheet for Patients: SugarRoll.be Fact Sheet for Healthcare Providers: https://www.woods-mathews.com/ This test is not yet approved or cleared by the [place] FDA and  has been authorized for detection and/or diagnosis of SARS-CoV-2 by FDA under an Emergency Use Authorization (EUA). This EUA will remain  in effect (mea ning this test can be used) for the duration of the COVID-19 declaration under Section 564(b)(1) of the Act, 21 U.S.C. section 360bbb-3(b)(1), unless the authorization is terminated or revoked sooner. Performed at [facility], [place] [address] [place], [place] [address]   MRSA PCR Screening     Status: Abnormal   Collection Time: 04/26/19  2:41 AM   Specimen: Nasal Mucosa; Nasopharyngeal  Result Value Ref Range Status   MRSA by PCR POSITIVE (A) NEGATIVE Final    Comment:        The GeneXpert MRSA Assay (FDA approved for NASAL specimens only), is one component of a comprehensive MRSA colonization surveillance program. It is not intended to diagnose MRSA infection nor to guide or monitor treatment for MRSA infections. RESULT CALLED  TO, READ BACK BY AND VERIFIED WITHJarrett Soho LETCHFORD @ [id number] ON 04/26/2019 Sandy Salaam Performed at [facility], [place] [address], [place], [place] [address]       Studies: CT Abdomen Pelvis Wo Contrast  Result Date: 04/14/2019 CLINICAL DATA:  Weakness. EXAM: CT ABDOMEN AND PELVIS WITHOUT CONTRAST TECHNIQUE: Multidetector CT imaging of the abdomen and pelvis was performed following the standard protocol without IV contrast. COMPARISON:  None. FINDINGS: Lower chest: No acute abnormality. Scarring/atelectasis at the right lung base. Hepatobiliary: No focal liver abnormality. Status post cholecystectomy. Moderate intra and extrahepatic biliary dilatation. Pancreas: Atrophic.  No ductal dilatation or surrounding inflammatory changes. Spleen: Normal in size without focal abnormality. Adrenals/Urinary Tract: The adrenal glands are unremarkable. Left greater than right renal atrophy. 1.4 cm hyperdense lesion in the upper pole of the left kidney likely a proteinaceous or hemorrhagic cyst. No renal calculi or hydronephrosis. Moderately distended bladder without wall thickening. Stomach/Bowel: Stomach is within normal limits. Appendix appears normal. No evidence of bowel wall thickening, distention, or inflammatory changes. Left-sided colonic diverticulosis. Vascular/Lymphatic: Aortic atherosclerosis. No enlarged abdominal or pelvic lymph nodes. Reproductive: Uterus and bilateral adnexa are unremarkable. Other: No abdominal wall hernia or abnormality. No abdominopelvic ascites. No pneumoperitoneum. Musculoskeletal: No acute or significant osseous findings. IMPRESSION: 1.  No acute intra-abdominal process. 2. Moderate intra and extrahepatic biliary dilatation may be related to post cholecystectomy state. Correlate with LFTs. 3. Moderately distended bladder.  Correlate for urinary retention. 4.  Aortic atherosclerosis (ICD10-I70.0). Electronically Signed   By: Titus Dubin M.D.   On: 04/26/2019 19:28    CT Head Wo Contrast  Result Date: 04/06/2019 CLINICAL DATA:  Altered mental status EXAM: CT HEAD WITHOUT CONTRAST TECHNIQUE: Contiguous axial images were obtained from the base of the skull through the vertex without intravenous contrast. COMPARISON:  None. FINDINGS: Brain: No evidence of acute infarction, hemorrhage, extra-axial collection, ventriculomegaly, or mass effect. 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 Mild encephalomalacia of the right temporal lobe. Generalized cerebral atrophy. Periventricular white matter low attenuation likely secondary to microangiopathy. Vascular: Cerebrovascular atherosclerotic calcifications are noted. Skull: Negative for fracture or focal lesion. Sinuses/Orbits: Visualized portions of the orbits are unremarkable. Visualized portions of the paranasal sinuses are unremarkable. Visualized portions of the mastoid air cells are unremarkable. Other: None. IMPRESSION: 1.   1.  No acute intracranial pathology. 2. Chronic microvascular disease and cerebral atrophy. 3. 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 Mild encephalomalacia of the right temporal lobe. Electronically Signed   By: Kathreen Devoid   On: 04/26/2019 19:24   DG Chest Port 1 View  Result Date: 04/13/2019 CLINICAL DATA:  Acute altered mental status and weakness. EXAM: PORTABLE CHEST 1 VIEW COMPARISON:  01/21/2018 FINDINGS: Stable mild cardiomegaly. Aortic atherosclerosis incidentally noted. Mild scarring is seen in the right lung base. No evidence of pulmonary infiltrate or edema. No evidence of pneumothorax or pleural effusion. IMPRESSION: Mild cardiomegaly.  No active lung disease. Electronically Signed   By: Marlaine Hind M.D.   On: 04/24/2019 19:12    Scheduled Meds:  vitamin C  500 mg Oral Daily   Chlorhexidine Gluconate Cloth  6 each Topical Daily   [START ON 04/27/2019] heparin injection (subcutaneous)  5,000 Units Subcutaneous Q8H    multivitamin with minerals  1 tablet Oral Daily   mupirocin ointment  1 application Nasal BID   zinc sulfate  220 mg Oral Daily    Continuous Infusions:  ceFEPime (MAXIPIME) IV     dextrose 100 mL/hr at 04/26/19 1246     LOS: 1 day     Alma Friendly, MD [facility]  If 7PM-7AM, please contact night-coverage [email or web address] 04/26/2019, 1:02 PM

## 2019-04-26 NOTE — Progress Notes (Signed)
MD paged due to heart rate between 50's-60's, has been 70-80's afib. Patient is DNR. Patient has has 2-3 second pauses occasionally as well. Periods of apnea. MD says she will let the daughter know.

## 2019-04-26 NOTE — Progress Notes (Signed)
CRITICAL VALUE ALERT  Critical Value:  COVID +  Date & Time Notied:  04/26/2019,1149  Provider Notified: Horris Latino, MD  Orders Received/Actions taken: Continue Airborne and Contact Precautions

## 2019-04-26 NOTE — Progress Notes (Signed)
Pharmacy Antibiotic Note  Andrea Shepard is a 85 y.o. female admitted on 04/22/2019 with sepsis.  Pharmacy has been consulted for Vancomycin, cefepime dosing.  Plan: Vancomycin 1gm iv x1, then Vancomycin 500 mg IV Q 24 hrs. Goal AUC 400-550. Expected AUC: 464 SCr used: 1.87  Cefepime 2gm iv x1, then 2gm iv q24hr   Height: 5\' 3"  (160 cm) Weight: 101 lb 10.1 oz (46.1 kg) IBW/kg (Calculated) : 52.4  Temp (24hrs), Avg:96.4 F (35.8 C), Min:95.5 F (35.3 C), Max:97.5 F (36.4 C)  Recent Labs  Lab 04/07/2019 1838 04/20/2019 2206 04/13/2019 2323  WBC 16.5* 17.7*  --   CREATININE 1.81* 1.87*  --   LATICACIDVEN 2.3*  --  2.8*    Estimated Creatinine Clearance: 12.5 mL/min (A) (by C-G formula based on SCr of 1.87 mg/dL (H)).    Allergies  Allergen Reactions  . Levaquin [Levofloxacin] Other (See Comments)    unknown reaction    Antimicrobials this admission: Vancomycin 04/12/2019 >> Cefepime 04/12/2019 >>   Dose adjustments this admission: -  Microbiology results: -  Thank you for allowing pharmacy to be a part of this patient's care.  Andrea Shepard 04/26/2019 6:25 AM

## 2019-04-26 NOTE — Progress Notes (Addendum)
Pt admitted to [facility] ICU/SD at 0235.  Pt arrived with bilateral wrists and ankles restrained.  After moving to bed and primary nurse assessment, pt still pullling at lines, cords, and medical equipment, so bilateral wrists still needing restrained.  However, patient no longer needing ankles restrained.  Restraint order changed to reflect the lesser level of restraint.  Also, Jeanie Sewer (ED RN), notified of need to complete restraint documentation during the time patient was in the emergency room.  Jacqulyn Ducking ICU/SD RN4 / Care Coordinator / Rapid Response Nurse Rapid Response Number:  [id number] ICU Charge Nurse Number:  [phone]

## 2019-04-26 NOTE — Progress Notes (Signed)
Initial Nutrition Assessment  DOCUMENTATION CODES:   Underweight  INTERVENTION:  - will order daily multivitamin with minerals. - will provide other interventions at follow-up, if warranted at that time.    NUTRITION DIAGNOSIS:   Inadequate oral intake related to acute illness, lethargy/confusion as evidenced by meal completion < 25%, other (comment)(RN report).  GOAL:   Patient will meet greater than or equal to 90% of their needs  MONITOR:   PO intake, Supplement acceptance, Diet advancement, Labs, Weight trends  REASON FOR ASSESSMENT:   Malnutrition Screening Tool  ASSESSMENT:   85 y.o. female with medical history significant of dementia. She resides at a nursing home and was sent to the ED d/t AMS. At baseline, she is ambulatory and interactive, but she had marked decline in the 24 hours PRA. In the ED, her temperature was found to be 95.5 degrees F. She was thought to be septic with unclear source. COVID test pending. She was placed on [facility] and admitted for likely sepsis thought to be 2/2 UTI.  No intakes documented since admission. Flow sheet documentation indicates that patient is disoriented x4. Able to talk with RN who reports patient has not had anything PO this morning and that she remains very confused. Patient's mental status at this time does not permit her to safely consume anything.   Per chart review, current weight is 102 lb. PTA, the only documented weight was on 01/21/18 when she weighed 125 lb. This indicates 23 lb weight loss (18% body weight) in the past 15 months; not significant for time frame but unsure if weight loss has occurred more acutely.   Per notes: - sepsis, leukocytosis - hypothermia--Bair hugger - acute metabolic encephalopathy superimposed on hx of dementia - AKI--thought to be 2/2 sepsis and dehydration - hypernatremia--IV fluids ordered    Labs reviewed; Na: 157 mmol/l, Cl: 124 mmol/l, BUN: 92 mg/dl, creatinine: 1.66 mg/dl, Ca:  8.2 mg/dl, GFR: 26 ml/min. Medications reviewed. IVF; NS @ 75 ml/hr.    NUTRITION - FOCUSED PHYSICAL EXAM:  unable to complete for patient on airborne precautions.   Diet Order:   Diet Order            Diet clear liquid Room service appropriate? Yes; Fluid consistency: Thin  Diet effective now              EDUCATION NEEDS:   No education needs have been identified at this time  Skin:  Skin Assessment: Reviewed RN Assessment  Last BM:  PTA/unknown  Height:   Ht Readings from Last 1 Encounters:  04/26/19 5\' 3"  (1.6 m)    Weight:   Wt Readings from Last 1 Encounters:  04/26/19 46.1 kg    Ideal Body Weight:     BMI:  Body mass index is 18 kg/m.  Estimated Nutritional Needs:   Kcal:  1520-1705 kcal  Protein:  75-90 grams  Fluid:  >/= 2 L/day      Jarome Matin, MS, RD, LDN, [facility] Inpatient Clinical Dietitian Pager # [phone] After hours/weekend pager # [phone]

## 2019-04-27 DIAGNOSIS — Z7189 Other specified counseling: Secondary | ICD-10-CM

## 2019-04-27 LAB — COMPREHENSIVE METABOLIC PANEL
ALT: 55 U/L — ABNORMAL HIGH (ref 0–44)
AST: 34 U/L (ref 15–41)
Albumin: 2.8 g/dL — ABNORMAL LOW (ref 3.5–5.0)
Alkaline Phosphatase: 48 U/L (ref 38–126)
Anion gap: 12 (ref 5–15)
BUN: 73 mg/dL — ABNORMAL HIGH (ref 8–23)
CO2: 24 mmol/L (ref 22–32)
Calcium: 8 mg/dL — ABNORMAL LOW (ref 8.9–10.3)
Chloride: 116 mmol/L — ABNORMAL HIGH (ref 98–111)
Creatinine, Ser: 1.42 mg/dL — ABNORMAL HIGH (ref 0.44–1.00)
GFR calc Af Amer: 36 mL/min — ABNORMAL LOW (ref >60)
GFR calc non Af Amer: 31 mL/min — ABNORMAL LOW (ref >60)
Glucose, Bld: 105 mg/dL — ABNORMAL HIGH (ref 70–99)
Potassium: 4.1 mmol/L (ref 3.5–5.1)
Sodium: 152 mmol/L — ABNORMAL HIGH (ref 135–145)
Total Bilirubin: 1 mg/dL (ref 0.3–1.2)
Total Protein: 5.2 g/dL — ABNORMAL LOW (ref 6.5–8.1)

## 2019-04-27 LAB — CBC WITH DIFFERENTIAL/PLATELET
Abs Immature Granulocytes: 0.11 10*3/uL — ABNORMAL HIGH (ref 0.00–0.07)
Basophils Absolute: 0 10*3/uL (ref 0.0–0.1)
Basophils Relative: 0 %
Eosinophils Absolute: 0 10*3/uL (ref 0.0–0.5)
Eosinophils Relative: 0 %
HCT: 48.4 % — ABNORMAL HIGH (ref 36.0–46.0)
Hemoglobin: 14.6 g/dL (ref 12.0–15.0)
Immature Granulocytes: 1 %
Lymphocytes Relative: 9 %
Lymphs Abs: 0.9 10*3/uL (ref 0.7–4.0)
MCH: 30.4 pg (ref 26.0–34.0)
MCHC: 30.2 g/dL (ref 30.0–36.0)
MCV: 100.8 fL — ABNORMAL HIGH (ref 80.0–100.0)
Monocytes Absolute: 0.9 10*3/uL (ref 0.1–1.0)
Monocytes Relative: 9 %
Neutro Abs: 8.4 10*3/uL — ABNORMAL HIGH (ref 1.7–7.7)
Neutrophils Relative %: 81 %
Platelets: 127 10*3/uL — ABNORMAL LOW (ref 150–400)
RBC: 4.8 MIL/uL (ref 3.87–5.11)
RDW: 13.9 % (ref 11.5–15.5)
WBC: 10.4 10*3/uL (ref 4.0–10.5)
nRBC: 0 % (ref 0.0–0.2)

## 2019-04-27 LAB — FERRITIN: Ferritin: 326 ng/mL — ABNORMAL HIGH (ref 11–307)

## 2019-04-27 LAB — D-DIMER, QUANTITATIVE: D-Dimer, Quant: 1.98 ug/mL-FEU — ABNORMAL HIGH (ref 0.00–0.50)

## 2019-04-27 LAB — C-REACTIVE PROTEIN: CRP: <0.5 mg/dL (ref <1.0)

## 2019-04-27 MED ORDER — HALOPERIDOL LACTATE 2 MG/ML PO CONC
0.5000 mg | ORAL | Status: DC | PRN
  Filled 2019-04-27: qty 0.3

## 2019-04-27 MED ORDER — ACETAMINOPHEN 325 MG PO TABS: 650.0000 mg | ORAL_TABLET | Freq: Four times a day (QID) | ORAL | Status: DC | PRN

## 2019-04-27 MED ORDER — LOPERAMIDE HCL 2 MG PO CAPS: 2.0000 mg | ORAL_CAPSULE | ORAL | Status: DC | PRN

## 2019-04-27 MED ORDER — MORPHINE SULFATE (CONCENTRATE) 10 MG/0.5ML PO SOLN: 5.0000 mg | ORAL | Status: DC | PRN

## 2019-04-27 MED ORDER — HALOPERIDOL 0.5 MG PO TABS
0.5000 mg | ORAL_TABLET | ORAL | Status: DC | PRN
  Filled 2019-04-27: qty 1

## 2019-04-27 MED ORDER — GLYCOPYRROLATE 0.2 MG/ML IJ SOLN
0.2000 mg | INTRAMUSCULAR | Status: DC | PRN
  Filled 2019-04-27: qty 1

## 2019-04-27 MED ORDER — LACTATED RINGERS IV BOLUS
500.0000 mL | Freq: Once | INTRAVENOUS | Status: AC
  Administered 2019-04-27: 500 mL via INTRAVENOUS

## 2019-04-27 MED ORDER — HALOPERIDOL LACTATE 5 MG/ML IJ SOLN
0.5000 mg | INTRAMUSCULAR | Status: DC | PRN
  Administered 2019-04-30 (×2): 0.5 mg via INTRAVENOUS
  Filled 2019-04-27 (×3): qty 1

## 2019-04-27 MED ORDER — GLYCOPYRROLATE 1 MG PO TABS
1.0000 mg | ORAL_TABLET | ORAL | Status: DC | PRN
  Filled 2019-04-27: qty 1

## 2019-04-27 MED ORDER — ACETAMINOPHEN 650 MG RE SUPP: 650.0000 mg | Freq: Four times a day (QID) | RECTAL | Status: DC | PRN

## 2019-04-27 MED ORDER — ONDANSETRON HCL 4 MG/2ML IJ SOLN: 4.0000 mg | Freq: Four times a day (QID) | INTRAMUSCULAR | Status: DC | PRN

## 2019-04-27 MED ORDER — POLYVINYL ALCOHOL 1.4 % OP SOLN: 1.0000 [drp] | Freq: Four times a day (QID) | OPHTHALMIC | Status: DC | PRN

## 2019-04-27 MED ORDER — MORPHINE 100MG IN NS 100ML (1MG/ML) PREMIX INFUSION
1.0000 mg/h | INTRAVENOUS | Status: DC
  Administered 2019-04-27 – 2019-04-30 (×2): 1 mg/h via INTRAVENOUS
  Administered 2019-05-01 – 2019-05-03 (×2): 3 mg/h via INTRAVENOUS
  Filled 2019-04-27 (×4): qty 100

## 2019-04-27 MED ORDER — BIOTENE DRY MOUTH MT LIQD: 15.0000 mL | OROMUCOSAL | Status: DC | PRN

## 2019-04-27 MED ORDER — MORPHINE BOLUS VIA INFUSION
1.0000 mg | INTRAVENOUS | Status: DC | PRN
  Administered 2019-04-28 – 2019-04-30 (×2): 1 mg via INTRAVENOUS
  Filled 2019-04-27: qty 1

## 2019-04-27 MED ORDER — DIPHENHYDRAMINE HCL 50 MG/ML IJ SOLN: 12.5000 mg | INTRAMUSCULAR | Status: DC | PRN

## 2019-04-27 MED ORDER — ONDANSETRON 4 MG PO TBDP: 4.0000 mg | ORAL_TABLET | Freq: Four times a day (QID) | ORAL | Status: DC | PRN

## 2019-04-27 MED ORDER — BISACODYL 10 MG RE SUPP: 10.0000 mg | Freq: Every day | RECTAL | Status: DC | PRN

## 2019-04-27 NOTE — Progress Notes (Addendum)
MD paged for HR in 40s-50s. 572mL bolus of LR ordered & started. Will continue to monitor.  [facility] paged again for HR 30s-50s. Second LR bolus ordered and started.

## 2019-04-27 NOTE — Consult Note (Signed)
Palliative Care Consult Note  Reason for Consult: End of life care assistance and psychosocial support  Palliative care consult received.  Chart reviewed including personal review of pertinent labs and imaging.  Discussed case with bedside staff as well as Dr. Horris Latino.  Briefly, Andrea Shepard is a 85 year old female with past medical history of dementia who lives in a skilled facility and was sent because of altered mental status.  At baseline she was ambulatory and interactive but had a marked decline prior to admission.  Concern for sepsis further complicated with hypernatremia.  She was positive for COVID-19.  Plan on admission was for 24 hours of hydration and antibiotics with possible transition to comfort care if no improvement.  I discussed with Dr. Horris Latino and she has already been in discussion with daughter, Andrea Shepard, who has elected to transition to comfort care.  I saw Andrea Shepard and limited physical examination secondary to COVID-19 pandemic and precautions.  I called and was able to reach her daughter, Andrea Shepard.  I introduced palliative care as specialized medical care for people living with serious illness. It focuses on providing relief from the symptoms and stress of a serious illness. The goal is to improve quality of life for both the patient and the family.  We reviewed Ms. Wente's clinical course as well as wishes moving forward for palliative, comfort focused care path.  I shared with Andrea Shepard that Andrea Shepard appears to me to be actively dying with likely prognosis of hours to days at best.  We reviewed plans for comfort care.  She asked me to ensure that her mother received message that she and her brother Andrea Shepard) love her in which they can be present with her.  Appreciate RN assistance in delivering this message.  -DNR/DNI -Transition to full comfort care.  Comfort care orders placed by attending physician. -I did stop some basic care orders that are not likely to add to her comfort moving  forward.   -Anticipate hospital death.  I believe her prognosis at this point in time is hours to days at best.  I discussed this with her daughter on the phone today.  Questions and concerns addressed.   PMT will continue to support holistically.  Time in: 1445 Time out: 1525  Total time: 40 minutes  Greater than 50%  of this time was spent counseling and coordinating care related to the above assessment and plan.  Micheline Rough, MD [facility] [phone]

## 2019-04-27 NOTE — Progress Notes (Addendum)
PROGRESS NOTE  Andrea Shepard [id number] DOB: Mar 07, 1920 DOA: 04/18/2019 PCP: [name], MD  HPI/Recap of past 24 hours: HPI from Dr Cyd Andrea Shepard is a 85 y.o. female with medical history significant of dementia from nursing home sent in because of altered mental status and not acting her normal.  At patient's baseline she is ambulatory and interactive she had a marked decline in the last 24 hours. No history can be obtained from patient due to her dementia.  On arrival her temperature was found to be 95.5 with an elevated white count.  Patient thought to be septic of unclear source.  Of note, pt tested positive for Covid about 2 weeks ago per her daughter. She is been placed on a bear hugger and referred for admission for likely sepsis probably from UTI.    Today, patient still disoriented, restless, unable to perform ROS due to mental status.     Assessment/Plan: Principal Problem:   Sepsis ([place]) Active Problems:   Dementia ([place])   Acute metabolic encephalopathy   AKI (acute kidney injury) ([place])   Hypernatremia   Dehydration   Acute metabolic encephalopathy/dehydration/failure to thrive likely 2/2 Covid infection with a history of dementia Patient noted to be very dry Patient tested positive for Covid about 2 weeks ago in memory care unit, repeat this admission is positive Palliative/hospice consulted-transition to comfort care  Hypernatremia Sodium 157 on admission Likely due to above  ??Sepsis POA Vs dehydration Unknown etiology, may be just dehydration causing elevated lactic acid, WBC Noted to be hypothermic on presentation, improving, with leukocytosis Procalcitonin negative BC x2 pending UA negative, UC no growth Bear-Hugger for hypothermia prn Comfort care  AKI on ??CKD stage IV Last baseline creatinine in 2019 was 1.47  Elevated hemoglobin May be due to hemoconcentration from dehydration  Known COVID-19 infection Currently with no symptoms,  saturating well on room air Chest x-ray unremarkable Inflammatory markers fairly elevated No need for remdesivir or steroids  Dementia Delirium precautions  Goals of care Patient DNR on presentation Spoke to daughter on 04/27/19, had an extensive discussion with daughter, given patient's very very poor prognosis and hospital death anticipation.  Daughter who is POA agreed for comfort care, and initiation of morphine drip.  Daughter familiar with morphine drip as her brother was also transitioned to comfort care 2/2 pancreatic cancer Continue comfort care          Malnutrition Type:  Nutrition Problem: Inadequate oral intake Etiology: acute illness, lethargy/confusion   Malnutrition Characteristics:  Signs/Symptoms: meal completion < 25%, other (comment)(RN report)   Nutrition Interventions:  Interventions: Refer to RD note for recommendations    Estimated body mass index is 18 kg/m as calculated from the following:   Height as of this encounter: 5\' 3"  (1.6 m).   Weight as of this encounter: 46.1 kg.     Code Status: DNR  Family Communication: Spoke to daughter on 04/27/2019 as above  Disposition Plan: Anticipate hospital death   Consultants:  Palliative  Procedures:  None  Antimicrobials:  Stop cefepime  DVT prophylaxis: None, comfort care   Objective: Vitals:   04/27/19 1000 04/27/19 1200 04/27/19 1400 04/27/19 1700  BP: (!) 123/58 (!) 96/47 (!) 102/37   Pulse: (!) 43 (!) 38    Resp: 12 19 18 15   Temp:      TempSrc:      SpO2: 100% 94% 100% 100%  Weight:      Height:        Intake/Output  Summary (Last 24 hours) at 04/27/2019 1852 Last data filed at 04/27/2019 1419 Gross per 24 hour  Intake 2712.63 ml  Output 250 ml  Net 2462.63 ml   Filed Weights   04/26/19 0235  Weight: 46.1 kg    Exam:  General: NAD, restless, currently nonverbal, not oriented  Cardiovascular: S1, S2 present  Respiratory: CTAB  Abdomen: Soft,  nontender, nondistended, bowel sounds present  Musculoskeletal: No bilateral pedal edema noted  Skin: Normal  Psychiatry:  Unable to assess    Data Reviewed: CBC: Recent Labs  Lab 04/27/2019 1838 04/17/2019 2206 04/26/19 0649 04/27/19 0251  WBC 16.5* 17.7* 16.3* 10.4  NEUTROABS 15.3*  --  14.6* 8.4*  HGB 20.4* 18.6* 15.5* 14.6  HCT 65.4* 59.2* 50.6* 48.4*  MCV 99.4 99.5 101.8* 100.8*  PLT 166 174 144* [id number]*   Basic Metabolic Panel: Recent Labs  Lab 04/27/2019 1838 04/08/2019 2206 04/26/19 0649 04/26/19 1659 04/27/19 0251  NA 157*  --  157* 159* 152*  K 4.6  --  4.0 4.3 4.1  CL 116*  --  124* 121* 116*  CO2 27  --  22 23 24   GLUCOSE 121*  --  114* 100* 105*  BUN 98*  --  92* 85* 73*  CREATININE 1.81* 1.87* 1.66* 1.70* 1.42*  CALCIUM 9.6  --  8.2* 8.5* 8.0*   GFR: Estimated Creatinine Clearance: 16.5 mL/min (A) (by C-G formula based on SCr of 1.42 mg/dL (H)). Liver Function Tests: Recent Labs  Lab 04/13/2019 1838 04/26/19 0649 04/27/19 0251  AST 57* 30 34  ALT 95* 59* 55*  ALKPHOS 80 54 48  BILITOT 0.9 0.9 1.0  PROT 7.7 5.5* 5.2*  ALBUMIN 4.0 2.9* 2.8*   No results for input(s): LIPASE, AMYLASE in the last 168 hours. No results for input(s): AMMONIA in the last 168 hours. Coagulation Profile: Recent Labs  Lab 04/08/2019 1838  INR 1.0   Cardiac Enzymes: No results for input(s): CKTOTAL, CKMB, CKMBINDEX, TROPONINI in the last 168 hours. BNP (last 3 results) No results for input(s): PROBNP in the last 8760 hours. HbA1C: No results for input(s): HGBA1C in the last 72 hours. CBG: No results for input(s): GLUCAP in the last 168 hours. Lipid Profile: No results for input(s): CHOL, HDL, LDLCALC, TRIG, CHOLHDL, LDLDIRECT in the last 72 hours. Thyroid Function Tests: No results for input(s): TSH, T4TOTAL, FREET4, T3FREE, THYROIDAB in the last 72 hours. Anemia Panel: Recent Labs    04/27/19 0251  FERRITIN 326*   Urine analysis:    Component Value Date/Time    COLORURINE YELLOW 04/26/2019 1838   APPEARANCEUR CLEAR 03/28/2019 1838   LABSPEC 1.011 04/27/2019 1838   PHURINE 5.0 04/26/2019 1838   GLUCOSEU NEGATIVE 03/31/2019 1838   HGBUR NEGATIVE 04/10/2019 1838   BILIRUBINUR NEGATIVE 04/05/2019 1838   Benjamin Stain NEGATIVE 04/23/2019 1838   PROTEINUR NEGATIVE 04/02/2019 1838   NITRITE NEGATIVE 03/30/2019 1838   LEUKOCYTESUR NEGATIVE 04/05/2019 1838   Sepsis Labs: @LABRCNTIP (procalcitonin:4,lacticidven:4)  ) Recent Results (from the past 240 hour(s))  Blood Culture (routine x 2)     Status: None (Preliminary result)   Collection Time: 04/06/2019  6:38 PM   Specimen: BLOOD  Result Value Ref Range Status   Specimen Description   Final    BLOOD RIGHT ANTECUBITAL Performed at [facility], [place] [address] [place], [place] [address]    Special Requests   Final    BOTTLES DRAWN AEROBIC AND ANAEROBIC Blood Culture results may not be optimal due to  an inadequate volume of blood received in culture bottles Performed at [place] [address] [place], [place] [address]    Culture   Final    NO GROWTH 1 DAY Performed at [facility], [place] [address], [place], [place] [address]    Report Status PENDING  Incomplete  Blood Culture (routine x 2)     Status: None (Preliminary result)   Collection Time: 04/26/2019  6:38 PM   Specimen: BLOOD  Result Value Ref Range Status   Specimen Description   Final    BLOOD RIGHT ANTECUBITAL Performed at [place] [address] [place], [place] [address]    Special Requests   Final    BOTTLES DRAWN AEROBIC AND ANAEROBIC Blood Culture results may not be optimal due to an inadequate volume of blood received in culture bottles Performed at [facility] [address], [place], [place] [address]    Culture   Final    NO GROWTH 1 DAY Performed at [facility], [place] [address], [place], [place] [address]    Report Status  PENDING  Incomplete  Urine culture     Status: None   Collection Time: 04/12/2019  6:38 PM   Specimen: In/Out Cath Urine  Result Value Ref Range Status   Specimen Description   Final    IN/OUT CATH URINE Performed at [place] [address], [place], [place] [address]    Special Requests   Final    NONE Performed at [facility], [place] [address] [place], [place] [address]    Culture   Final    NO GROWTH Performed at [facility], [place] [address] [place], [place] [address]    Report Status 04/26/2019 FINAL  Final  SARS CORONAVIRUS 2 (TAT 6-24 HRS) Nasopharyngeal     Status: Abnormal   Collection Time: 04/11/2019  8:23 PM   Specimen: Nasopharyngeal  Result Value Ref Range Status   SARS Coronavirus 2 POSITIVE (A) NEGATIVE Final    Comment: RESULT CALLED TO, READ BACK BY AND VERIFIED WITH: Sharyn Creamer RN 11:50 04/26/19 ([email or web address]) (NOTE) SARS-CoV-2 target nucleic acids are NOT DETECTED. The SARS-CoV-2 RNA is generally detectable in upper and lower respiratory specimens during the acute phase of infection. Negative results do not preclude SARS-CoV-2 infection, do not rule out co-infections with other pathogens, and should not be used as the sole basis for treatment or other patient management decisions. Negative results must be combined with clinical observations, patient history, and epidemiological information. The expected result is Negative. Fact Sheet for Patients: SugarRoll.be Fact Sheet for Healthcare Providers: https://www.woods-mathews.com/ This test is not yet approved or cleared by the [place] FDA and  has been authorized for detection and/or diagnosis of SARS-CoV-2 by FDA under an Emergency Use Authorization (EUA). This EUA will remain  in effect (mea ning this test can be used) for the duration of the COVID-19 declaration under Section 564(b)(1) of the Act, 21 U.S.C. section  360bbb-3(b)(1), unless the authorization is terminated or revoked sooner. Performed at [facility], [place] [address] [place], [place] [address]   MRSA PCR Screening     Status: Abnormal   Collection Time: 04/26/19  2:41 AM   Specimen: Nasal Mucosa; Nasopharyngeal  Result Value Ref Range Status   MRSA by PCR POSITIVE (A) NEGATIVE Final    Comment:        The GeneXpert MRSA Assay (FDA approved  for NASAL specimens only), is one component of a comprehensive MRSA colonization surveillance program. It is not intended to diagnose MRSA infection nor to guide or monitor treatment for MRSA infections. RESULT CALLED TO, READ BACK BY AND VERIFIED WITHJarrett Soho LETCHFORD @ [id number] ON 04/26/2019 Sandy [place] Performed at [facility], [place] [address] [place], [place] [address]       Studies: No results found.  Scheduled Meds: . Chlorhexidine Gluconate Cloth  6 each Topical Daily    Continuous Infusions: . morphine 1 mg/hr (04/27/19 1423)     LOS: 2 days     Alma Friendly, MD [facility]  If 7PM-7AM, please contact night-coverage [email or web address] 04/27/2019, 6:52 PM

## 2019-04-28 NOTE — Progress Notes (Signed)
Daily Progress Note   Patient Name: Andrea Shepard       Date: 04/28/2019 DOB: [birth year]  Age: 86 y.o. MRN#: [id number] Attending Physician: Alma Friendly, MD Primary Care Physician: [name], MD Admit Date: 03/29/2019  Reason for Consultation/Follow-up: Psychosocial/spiritual support and Terminal Care  Subjective: Chart reviewed and discussed at length with bedside RN.  Andrea Shepard appears to be resting comfortably on low-dose morphine infusion.  Exam is limited secondary to Covid 19 infection.  I was able to speak with her daughter, [name], via phone.  We reviewed clinical course overnight and discussed again regarding plan for comfort care moving forward.  [name] expresses great appreciation for the care her mother has received, particularly from nursing staff who have been diligent in both caring for her mother and keeping [name] updated.  Length of Stay: 3  Current Medications: Scheduled Meds:  . Chlorhexidine Gluconate Cloth  6 each Topical Daily    Continuous Infusions: . morphine 1 mg/hr (04/28/19 0900)    PRN Meds: acetaminophen **OR** acetaminophen, antiseptic oral rinse, bisacodyl, diphenhydrAMINE, glycopyrrolate **OR** glycopyrrolate **OR** glycopyrrolate, haloperidol **OR** haloperidol **OR** haloperidol lactate, loperamide, LORazepam, morphine, morphine CONCENTRATE **OR** morphine CONCENTRATE, ondansetron **OR** ondansetron (ZOFRAN) IV, polyvinyl alcohol  Physical Exam   Somnolent. No respiratory distress. No picking or agitation.        Vital Signs: BP (!) 102/37   Pulse (!) 59   Temp 97.8 F (36.6 C) (Rectal)   Resp (!) 9   Ht 5\' 3"  (1.6 m)   Wt 46.1 kg   SpO2 98%   BMI 18.00 kg/m  SpO2: SpO2: 98 % O2 Device: O2 Device: Room Air O2 Flow Rate:     Intake/output summary:   Intake/Output Summary (Last 24 hours) at 04/28/2019 1327 Last data filed at 04/28/2019 0900 Gross per 24 hour  Intake 301.18 ml  Output 250 ml  Net 51.18 ml   LBM: Last BM Date: (UTA) Baseline Weight: Weight: 46.1 kg Most recent weight: Weight: 46.1 kg       Palliative Assessment/Data:    Flowsheet Rows     Most Recent Value  Intake Tab  Referral Department  Hospitalist  Unit at Time of Referral  Intermediate Care Unit  Palliative Care Primary Diagnosis  Sepsis/Infectious Disease  Date Notified  04/26/19  Palliative Care Type  New Palliative care  Reason for referral  Clarify Goals of Care, End of Life Care Assistance  Date of Admission  04/26/19  Date first seen by Palliative Care  04/27/19  # of days Palliative referral response time  1 Day(s)  # of days IP prior to Palliative referral  0  Clinical Assessment  Palliative Performance Scale Score  10%  Psychosocial & Spiritual Assessment  Palliative Care Outcomes  Palliative Care Outcomes  Provided end of life care assistance      Patient Active Problem List   Diagnosis Date Noted  . Sepsis ([place]) 04/02/2019  . Dementia ([place]) 04/06/2019  . Acute metabolic encephalopathy [id number]  . AKI (acute kidney injury) ([place]) 04/10/2019  . Hypernatremia 04/03/2019  . Dehydration 04/09/2019    Palliative Care Assessment & Plan   Patient Profile: Andrea Shepard is a 86 year old female with past medical history of dementia who presented from skilled facility.  She did not respond to trial of conservative interventions and was transitioned to focus on comfort care.  Recommendations/Plan:  Full comfort care.  Chart reviewed including use of as needed medications.  She appears comfortable on my exam today.  Continue same.  Discussed with patient's daughter, [name], via phone today.  Updated her on clinical course as well as anticipatory guidance on what to expect moving forward.  Goals of Care and  Additional Recommendations:  Limitations on Scope of Treatment: Full Comfort Care  Code Status:    Code Status Orders  (From admission, onward)         Start     Ordered   04/27/19 1230  Do not attempt resuscitation (DNR)  Continuous    Question Answer Comment  In the event of cardiac or respiratory ARREST Do not call a "code blue"   In the event of cardiac or respiratory ARREST Do not perform Intubation, CPR, defibrillation or ACLS   In the event of cardiac or respiratory ARREST Use medication by any route, position, wound care, and other measures to relive pain and suffering. May use oxygen, suction and manual treatment of airway obstruction as needed for comfort.      04/27/19 1235        Code Status History    Date Active Date Inactive Code Status Order ID Comments User Context   04/26/2019 0256 04/27/2019 1235 DNR [id number]  Phillips Grout, MD Inpatient   04/05/2019 2209 04/26/2019 0256 Full Code [id number]  Phillips Grout, MD ED   Advance Care Planning Activity       Prognosis:   Hours - Days  Discharge Planning:  Anticipated Hospital Death  Care plan was discussed with daughter, RN  Thank you for allowing the Palliative Medicine Team to assist in the care of this patient.   Total Time 25 Prolonged Time Billed No      Greater than 50%  of this time was spent counseling and coordinating care related to the above assessment and plan.  Micheline Rough, MD  Please contact Palliative Medicine Team phone at [phone] for questions and concerns.

## 2019-04-28 NOTE — Progress Notes (Signed)
PROGRESS NOTE  Andrea Shepard [id number] DOB: 1919-06-28 DOA: 04/18/2019 PCP: [name], MD  HPI/Recap of past 24 hours: HPI from Dr Cyd Silence Macik is a 86 y.o. female with medical history significant of dementia from nursing home sent in because of altered mental status and not acting her normal.  At patient's baseline she is ambulatory and interactive she had a marked decline in the last 24 hours. No history can be obtained from patient due to her dementia.  On arrival her temperature was found to be 95.5 with an elevated white count.  Patient thought to be septic of unclear source.  Of note, pt tested positive for Covid about 2 weeks ago per her daughter. She is been placed on a bear hugger and referred for admission for likely sepsis probably from UTI.    Today, patient noted to be comfortable, somnolent, no respiratory distress or agitation     Assessment/Plan: Principal Problem:   Sepsis ([place]) Active Problems:   Dementia ([place])   Acute metabolic encephalopathy   AKI (acute kidney injury) ([place])   Hypernatremia   Dehydration   Acute metabolic encephalopathy/dehydration/failure to thrive likely 2/2 Covid infection with a history of dementia Patient noted to be very dry Patient tested positive for Covid about 2 weeks ago in memory care unit, repeat this admission is positive Palliative/hospice consulted-transition to comfort care  Hypernatremia Sodium 157 on admission Likely due to above  ??Sepsis POA Vs dehydration Unknown etiology, may be just dehydration causing elevated lactic acid, WBC Noted to be hypothermic on presentation, improving, with leukocytosis Procalcitonin negative BC x2 NGTD UA negative, UC no growth Bear-Hugger for hypothermia prn Comfort care  AKI on ??CKD stage IV Last baseline creatinine in 2019 was 1.47  Elevated hemoglobin Likely due to hemoconcentration from dehydration  Known COVID-19 infection Currently with no symptoms Chest  x-ray unremarkable Inflammatory markers fairly elevated No need for remdesivir or steroids  Dementia  Goals of care Patient DNR on presentation Spoke to daughter on 04/27/19, had an extensive discussion with daughter, given patient's very very poor prognosis and hospital death anticipation.  Daughter who is POA agreed for comfort care, and initiation of morphine drip.  Daughter familiar with morphine drip as her brother was also transitioned to comfort care 2/2 pancreatic cancer Continue comfort care        Malnutrition Type:  Nutrition Problem: Inadequate oral intake Etiology: acute illness, lethargy/confusion   Malnutrition Characteristics:  Signs/Symptoms: meal completion < 25%, other (comment)(RN report)   Nutrition Interventions:  Interventions: Refer to RD note for recommendations    Estimated body mass index is 18 kg/m as calculated from the following:   Height as of this encounter: 5\' 3"  (1.6 m).   Weight as of this encounter: 46.1 kg.     Code Status: DNR  Family Communication: Spoke to daughter on 04/27/2019 as above  Disposition Plan: Anticipate hospital death   Consultants:  Palliative  Procedures:  None  Antimicrobials:  Stop cefepime  DVT prophylaxis: None, comfort care   Objective: Vitals:   04/28/19 1200 04/28/19 1300 04/28/19 1400 04/28/19 1500  BP:      Pulse: 63 65 73 93  Resp: (!) 7 (!) 8 10 (!) 9  Temp:      TempSrc:      SpO2: 98% 92% 98% 100%  Weight:      Height:        Intake/Output Summary (Last 24 hours) at 04/28/2019 1651 Last data filed at 04/28/2019 1500  Gross per 24 hour  Intake 155.58 ml  Output 250 ml  Net -94.42 ml   Filed Weights   04/26/19 0235  Weight: 46.1 kg    Exam:  General: NAD, somnolent, appears comfortable  Cardiovascular: S1, S2 present  Respiratory: CTAB  Abdomen: Soft, nontender, nondistended, bowel sounds present  Musculoskeletal: No bilateral pedal edema noted  Skin:  Normal  Psychiatry:  Unable to assess    Data Reviewed: CBC: Recent Labs  Lab 04/22/2019 1838 04/04/2019 2206 04/26/19 0649 04/27/19 0251  WBC 16.5* 17.7* 16.3* 10.4  NEUTROABS 15.3*  --  14.6* 8.4*  HGB 20.4* 18.6* 15.5* 14.6  HCT 65.4* 59.2* 50.6* 48.4*  MCV 99.4 99.5 101.8* 100.8*  PLT 166 174 144* [id number]*   Basic Metabolic Panel: Recent Labs  Lab 04/17/2019 1838 04/12/2019 2206 04/26/19 0649 04/26/19 1659 04/27/19 0251  NA 157*  --  157* 159* 152*  K 4.6  --  4.0 4.3 4.1  CL 116*  --  124* 121* 116*  CO2 27  --  22 23 24   GLUCOSE 121*  --  114* 100* 105*  BUN 98*  --  92* 85* 73*  CREATININE 1.81* 1.87* 1.66* 1.70* 1.42*  CALCIUM 9.6  --  8.2* 8.5* 8.0*   GFR: Estimated Creatinine Clearance: 16.5 mL/min (A) (by C-G formula based on SCr of 1.42 mg/dL (H)). Liver Function Tests: Recent Labs  Lab 04/13/2019 1838 04/26/19 0649 04/27/19 0251  AST 57* 30 34  ALT 95* 59* 55*  ALKPHOS 80 54 48  BILITOT 0.9 0.9 1.0  PROT 7.7 5.5* 5.2*  ALBUMIN 4.0 2.9* 2.8*   No results for input(s): LIPASE, AMYLASE in the last 168 hours. No results for input(s): AMMONIA in the last 168 hours. Coagulation Profile: Recent Labs  Lab 04/11/2019 1838  INR 1.0   Cardiac Enzymes: No results for input(s): CKTOTAL, CKMB, CKMBINDEX, TROPONINI in the last 168 hours. BNP (last 3 results) No results for input(s): PROBNP in the last 8760 hours. HbA1C: No results for input(s): HGBA1C in the last 72 hours. CBG: No results for input(s): GLUCAP in the last 168 hours. Lipid Profile: No results for input(s): CHOL, HDL, LDLCALC, TRIG, CHOLHDL, LDLDIRECT in the last 72 hours. Thyroid Function Tests: No results for input(s): TSH, T4TOTAL, FREET4, T3FREE, THYROIDAB in the last 72 hours. Anemia Panel: Recent Labs    04/27/19 0251  FERRITIN 326*   Urine analysis:    Component Value Date/Time   COLORURINE YELLOW 04/08/2019 1838   APPEARANCEUR CLEAR 04/02/2019 1838   LABSPEC 1.011 04/16/2019 1838    PHURINE 5.0 04/13/2019 1838   GLUCOSEU NEGATIVE 04/24/2019 1838   HGBUR NEGATIVE 04/20/2019 1838   BILIRUBINUR NEGATIVE 03/28/2019 1838   Benjamin Stain NEGATIVE 04/01/2019 1838   PROTEINUR NEGATIVE 04/06/2019 1838   NITRITE NEGATIVE 04/19/2019 1838   LEUKOCYTESUR NEGATIVE 04/15/2019 1838   Sepsis Labs: @LABRCNTIP (procalcitonin:4,lacticidven:4)  ) Recent Results (from the past 240 hour(s))  Blood Culture (routine x 2)     Status: None (Preliminary result)   Collection Time: 04/27/2019  6:38 PM   Specimen: BLOOD  Result Value Ref Range Status   Specimen Description   Final    BLOOD RIGHT ANTECUBITAL Performed at [facility], [place] [address], [place], [place] [address]    Special Requests   Final    BOTTLES DRAWN AEROBIC AND ANAEROBIC Blood Culture results may not be optimal due to an inadequate volume of blood received in culture bottles Performed at [facility],  [place] [address], [place], [place] [address]    Culture   Final    NO GROWTH 2 DAYS Performed at [facility] [address] [place], [place] [address]    Report Status PENDING  Incomplete  Blood Culture (routine x 2)     Status: None (Preliminary result)   Collection Time: 04/24/2019  6:38 PM   Specimen: BLOOD  Result Value Ref Range Status   Specimen Description   Final    BLOOD RIGHT ANTECUBITAL Performed at [facility] [address] [place], [place] [address]    Special Requests   Final    BOTTLES DRAWN AEROBIC AND ANAEROBIC Blood Culture results may not be optimal due to an inadequate volume of blood received in culture bottles Performed at [facility] [address] [place], [place] [address]    Culture   Final    NO GROWTH 2 DAYS Performed at [facility] [address], [place], [place] [address]    Report Status PENDING  Incomplete  Urine culture     Status: None   Collection Time: 04/12/2019  6:38 PM   Specimen:  In/Out Cath Urine  Result Value Ref Range Status   Specimen Description   Final    IN/OUT CATH URINE Performed at [facility] [address] [place], [place] [address]    Special Requests   Final    NONE Performed at [facility], [place] [address] [place], [place] [address]    Culture   Final    NO GROWTH Performed at [facility], [place] [address], [place], [place] [address]    Report Status 04/26/2019 FINAL  Final  SARS CORONAVIRUS 2 (TAT 6-24 HRS) Nasopharyngeal     Status: Abnormal   Collection Time: 04/04/2019  8:23 PM   Specimen: Nasopharyngeal  Result Value Ref Range Status   SARS Coronavirus 2 POSITIVE (A) NEGATIVE Final    Comment: RESULT CALLED TO, READ BACK BY AND VERIFIED WITH: Sharyn Creamer RN 11:50 04/26/19 ([email or web address]) (NOTE) SARS-CoV-2 target nucleic acids are NOT DETECTED. The SARS-CoV-2 RNA is generally detectable in upper and lower respiratory specimens during the acute phase of infection. Negative results do not preclude SARS-CoV-2 infection, do not rule out co-infections with other pathogens, and should not be used as the sole basis for treatment or other patient management decisions. Negative results must be combined with clinical observations, patient history, and epidemiological information. The expected result is Negative. Fact Sheet for Patients: SugarRoll.be Fact Sheet for Healthcare Providers: https://www.woods-mathews.com/ This test is not yet approved or cleared by the [place] FDA and  has been authorized for detection and/or diagnosis of SARS-CoV-2 by FDA under an Emergency Use Authorization (EUA). This EUA will remain  in effect (mea ning this test can be used) for the duration of the COVID-19 declaration under Section 564(b)(1) of the Act, 21 U.S.C. section 360bbb-3(b)(1), unless the authorization is terminated or revoked sooner. Performed at [facility], [place] [address], [place], [place] [address]   MRSA PCR Screening     Status: Abnormal   Collection Time: 04/26/19  2:41 AM   Specimen: Nasal Mucosa; Nasopharyngeal  Result Value Ref Range Status   MRSA by PCR POSITIVE (A) NEGATIVE Final    Comment:        The GeneXpert MRSA Assay (FDA approved for NASAL specimens only), is one component of a comprehensive MRSA colonization surveillance program. It  is not intended to diagnose MRSA infection nor to guide or monitor treatment for MRSA infections. RESULT CALLED TO, READ BACK BY AND VERIFIED WITHJarrett Soho LETCHFORD @ [id number] ON 04/26/2019 Sandy Salaam Performed at [facility], [place] [address] [place], [place] [address]       Studies: No results found.  Scheduled Meds: . Chlorhexidine Gluconate Cloth  6 each Topical Daily    Continuous Infusions: . morphine 1 mg/hr (04/28/19 1500)     LOS: 3 days     Alma Friendly, MD [facility]  If 7PM-7AM, please contact night-coverage [email or web address] 04/28/2019, 4:51 PM

## 2019-04-28 DEATH — deceased

## 2019-04-29 NOTE — Progress Notes (Signed)
Daily Progress Note   Patient Name: Andrea Shepard       Date: 04/29/2019 DOB: October 15, 1919  Age: 86 y.o. MRN#: [id number] Attending Physician: Alma Friendly, MD Primary Care Physician: [name], MD Admit Date: 04/24/2019  Reason for Consultation/Follow-up: Psychosocial/spiritual support and Terminal Care  Subjective: Chart reviewed.  Andrea Shepard appears to be resting comfortably on low-dose morphine infusion.  Exam is limited secondary to Covid 19 infection.  I was able to speak with her daughter, Andrea Shepard, via phone.  She was able to visit yesterday and expressed great appreciation for care that her mother is receiving.  Length of Stay: 4  Current Medications: Scheduled Meds:  . Chlorhexidine Gluconate Cloth  6 each Topical Daily    Continuous Infusions: . morphine 1 mg/hr (04/29/19 0800)    PRN Meds: acetaminophen **OR** acetaminophen, antiseptic oral rinse, bisacodyl, diphenhydrAMINE, glycopyrrolate **OR** glycopyrrolate **OR** glycopyrrolate, haloperidol **OR** haloperidol **OR** haloperidol lactate, loperamide, LORazepam, morphine, morphine CONCENTRATE **OR** morphine CONCENTRATE, ondansetron **OR** ondansetron (ZOFRAN) IV, polyvinyl alcohol  Physical Exam   Somnolent. No respiratory distress. No picking or agitation.        Vital Signs: BP (!) 102/37   Pulse 94   Temp 97.8 F (36.6 C) (Rectal)   Resp 15   Ht 5\' 3"  (1.6 m)   Wt 46.1 kg   SpO2 99%   BMI 18.00 kg/m  SpO2: SpO2: 99 % O2 Device: O2 Device: Room Air O2 Flow Rate:    Intake/output summary:   Intake/Output Summary (Last 24 hours) at 04/29/2019 1039 Last data filed at 04/29/2019 0800 Gross per 24 hour  Intake 104.01 ml  Output --  Net 104.01 ml   LBM: Last BM Date: (UTA) Baseline Weight: Weight:  46.1 kg Most recent weight: Weight: 46.1 kg       Palliative Assessment/Data:    Flowsheet Rows     Most Recent Value  Intake Tab  Referral Department  Hospitalist  Unit at Time of Referral  Intermediate Care Unit  Palliative Care Primary Diagnosis  Sepsis/Infectious Disease  Date Notified  04/26/19  Palliative Care Type  New Palliative care  Reason for referral  Clarify Goals of Care, End of Life Care Assistance  Date of Admission  04/26/19  Date first seen by Palliative Care  04/27/19  # of days Palliative referral  response time  1 Day(s)  # of days IP prior to Palliative referral  0  Clinical Assessment  Palliative Performance Scale Score  10%  Psychosocial & Spiritual Assessment  Palliative Care Outcomes  Palliative Care Outcomes  Provided end of life care assistance      Patient Active Problem List   Diagnosis Date Noted  . Sepsis (Andrea Shepard) 04/21/2019  . Dementia (Andrea Shepard) 03/30/2019  . Acute metabolic encephalopathy [id number]  . AKI (acute kidney injury) (Andrea Shepard) 04/27/2019  . Hypernatremia 04/07/2019  . Dehydration 04/08/2019    Palliative Care Assessment & Plan   Patient Profile: [name] is a 86 year old female with past medical history of dementia who presented from skilled facility.  She did not respond to trial of conservative interventions and was transitioned to focus on comfort care.  Recommendations/Plan:  Full comfort care.  Chart reviewed including use of as needed medications.  She appears comfortable on my exam today.  Continue same.  Discussed with patient's daughter, Andrea Shepard, via phone today.  Updated her on clinical course as well as anticipatory guidance on what to expect moving forward.  Goals of Care and Additional Recommendations:  Limitations on Scope of Treatment: Full Comfort Care  Code Status:    Code Status Orders  (From admission, onward)         Start     Ordered   04/27/19 1230  Do not attempt resuscitation (DNR)  Continuous      Question Answer Comment  In the event of cardiac or respiratory ARREST Do not call a "code blue"   In the event of cardiac or respiratory ARREST Do not perform Intubation, CPR, defibrillation or ACLS   In the event of cardiac or respiratory ARREST Use medication by any route, position, wound care, and other measures to relive pain and suffering. May use oxygen, suction and manual treatment of airway obstruction as needed for comfort.      04/27/19 1235        Code Status History    Date Active Date Inactive Code Status Order ID Comments User Context   04/26/2019 0256 04/27/2019 1235 DNR [id number]  Phillips Grout, MD Inpatient   04/04/2019 2209 04/26/2019 0256 Full Code [id number]  Phillips Grout, MD ED   Advance Care Planning Activity       Prognosis:   Hours - Days  Discharge Planning:  Anticipated Hospital Death  Care plan was discussed with daughter, RN  Thank you for allowing the Palliative Medicine Team to assist in the care of this patient.   Total Time 20 Prolonged Time Billed No      Greater than 50%  of this time was spent counseling and coordinating care related to the above assessment and plan.  Micheline Rough, MD  Please contact Palliative Medicine Team phone at [phone] for questions and concerns.

## 2019-04-29 NOTE — Progress Notes (Addendum)
PROGRESS NOTE  Andrea Shepard [id number] DOB: 09-29-1919 DOA: 04/20/2019 PCP: Andrea Brothers, MD  HPI/Recap of past 24 hours: HPI from Dr Andrea Shepard is a 86 y.o. female with medical history significant of dementia from nursing home sent in because of altered mental status and not acting her normal.  At patient's baseline she is ambulatory and interactive she had a marked decline in the last 24 hours. No history can be obtained from patient due to her dementia.  On arrival her temperature was found to be 95.5 with an elevated white count.  Patient thought to be septic of unclear source.  Of note, pt tested positive for Covid about 2 weeks ago per her daughter. She is been placed on a bear hugger and referred for admission for likely sepsis probably from UTI.    Today, patient is nonresponsive, appears comfortable.     Assessment/Plan: Principal Problem:   Sepsis ([name]) Active Problems:   Dementia ([name])   Acute metabolic encephalopathy   AKI (acute kidney injury) ([name])   Hypernatremia   Dehydration   Acute metabolic encephalopathy/dehydration/failure to thrive likely 2/2 Covid infection with a history of dementia Patient noted to be very dry on admission Patient tested positive for Covid about 2 weeks ago in memory care unit, repeat this admission is positive Palliative/hospice consulted-transition to comfort care  Hypernatremia Sodium 157 on admission Likely due to above  Sepsis r/o likely severe dehydration Severe dehydration causing elevated lactic acid, WBC Noted to be hypothermic on presentation, improving, with leukocytosis Procalcitonin negative BC x2 NGTD UA negative, UC no growth Bear-Hugger for hypothermia prn Comfort care  AKI on ??CKD stage IV Last baseline creatinine in 2019 was 1.47  Elevated hemoglobin Likely due to hemoconcentration from dehydration  Known COVID-19 infection Currently with encephalopathy Chest x-ray unremarkable Inflammatory  markers fairly elevated Covid 19 is an active infection, no longer treated as pt is comfort care  Dementia  Underweight Nutrition consulted  Goals of care Patient DNR on presentation Spoke to daughter on 04/27/19, had an extensive discussion with daughter, given patient's very very poor prognosis and hospital death anticipation.  Daughter who is POA agreed for comfort care, and initiation of morphine drip.  Daughter familiar with morphine drip as her brother was also transitioned to comfort care 2/2 pancreatic cancer Continue comfort care        Malnutrition Type:  Nutrition Problem: Inadequate oral intake Etiology: acute illness, lethargy/confusion   Malnutrition Characteristics:  Signs/Symptoms: meal completion < 25%, other (comment)(RN report)   Nutrition Interventions:  Interventions: Refer to RD note for recommendations    Estimated body mass index is 18 kg/m as calculated from the following:   Height as of this encounter: 5\' 3"  (1.6 m).   Weight as of this encounter: 46.1 kg.     Code Status: DNR  Family Communication: Spoke to daughter on 04/27/2019 as above  Disposition Plan: Anticipate hospital death   Consultants: Palliative  Procedures: None  Antimicrobials: Stop cefepime  DVT prophylaxis: None, comfort care   Objective: Vitals:   04/29/19 1200 04/29/19 1300 04/29/19 1400 04/29/19 1500  BP:      Pulse: (!) 47 89 60 (!) 45  Resp: 17 (!) 23 15 18   Temp:      TempSrc:      SpO2: 95% 96% 99% 92%  Weight:      Height:        Intake/Output Summary (Last 24 hours) at 04/29/2019 1731 Last data filed at 04/29/2019  1500 Gross per 24 hour  Intake 19.01 ml  Output 500 ml  Net -480.99 ml   Filed Weights   04/26/19 0235  Weight: 46.1 kg    Exam: General: NAD, nonresponsive, appears comfortable Cardiovascular: S1, S2 present Respiratory: CTAB Abdomen: Soft, nontender, nondistended, bowel sounds present Musculoskeletal: No bilateral  pedal edema noted Skin: Normal Psychiatry:  Unable to assess   Data Reviewed: CBC: Recent Labs  Lab 04/21/2019 1838 04/05/2019 2206 04/26/19 0649 04/27/19 0251  WBC 16.5* 17.7* 16.3* 10.4  NEUTROABS 15.3*  --  14.6* 8.4*  HGB 20.4* 18.6* 15.5* 14.6  HCT 65.4* 59.2* 50.6* 48.4*  MCV 99.4 99.5 101.8* 100.8*  PLT 166 174 144* [id number]*   Basic Metabolic Panel: Recent Labs  Lab 04/02/2019 1838 04/27/2019 2206 04/26/19 0649 04/26/19 1659 04/27/19 0251  NA 157*  --  157* 159* 152*  K 4.6  --  4.0 4.3 4.1  CL 116*  --  124* 121* 116*  CO2 27  --  22 23 24   GLUCOSE 121*  --  114* 100* 105*  BUN 98*  --  92* 85* 73*  CREATININE 1.81* 1.87* 1.66* 1.70* 1.42*  CALCIUM 9.6  --  8.2* 8.5* 8.0*   GFR: Estimated Creatinine Clearance: 16.5 mL/min (A) (by C-G formula based on SCr of 1.42 mg/dL (H)). Liver Function Tests: Recent Labs  Lab 04/07/2019 1838 04/26/19 0649 04/27/19 0251  AST 57* 30 34  ALT 95* 59* 55*  ALKPHOS 80 54 48  BILITOT 0.9 0.9 1.0  PROT 7.7 5.5* 5.2*  ALBUMIN 4.0 2.9* 2.8*   No results for input(s): LIPASE, AMYLASE in the last 168 hours. No results for input(s): AMMONIA in the last 168 hours. Coagulation Profile: Recent Labs  Lab 04/24/2019 1838  INR 1.0   Cardiac Enzymes: No results for input(s): CKTOTAL, CKMB, CKMBINDEX, TROPONINI in the last 168 hours. BNP (last 3 results) No results for input(s): PROBNP in the last 8760 hours. HbA1C: No results for input(s): HGBA1C in the last 72 hours. CBG: No results for input(s): GLUCAP in the last 168 hours. Lipid Profile: No results for input(s): CHOL, HDL, LDLCALC, TRIG, CHOLHDL, LDLDIRECT in the last 72 hours. Thyroid Function Tests: No results for input(s): TSH, T4TOTAL, FREET4, T3FREE, THYROIDAB in the last 72 hours. Anemia Panel: Recent Labs    04/27/19 0251  FERRITIN 326*   Urine analysis:    Component Value Date/Time   COLORURINE YELLOW 04/22/2019 1838   APPEARANCEUR CLEAR 04/21/2019 1838   LABSPEC  1.011 03/30/2019 1838   PHURINE 5.0 04/27/2019 1838   GLUCOSEU NEGATIVE 04/07/2019 1838   HGBUR NEGATIVE 04/23/2019 1838   BILIRUBINUR NEGATIVE 04/03/2019 1838   Benjamin Stain NEGATIVE 04/27/2019 1838   PROTEINUR NEGATIVE 04/23/2019 1838   NITRITE NEGATIVE 04/01/2019 1838   LEUKOCYTESUR NEGATIVE 04/04/2019 1838   Sepsis Labs: @LABRCNTIP (procalcitonin:4,lacticidven:4)  ) Recent Results (from the past 240 hour(s))  Blood Culture (routine x 2)     Status: None (Preliminary result)   Collection Time: 04/27/2019  6:38 PM   Specimen: BLOOD  Result Value Ref Range Status   Specimen Description   Final    BLOOD RIGHT ANTECUBITAL Performed at [facility], [place] [address], [place], [place] [address]    Special Requests   Final    BOTTLES DRAWN AEROBIC AND ANAEROBIC Blood Culture results may not be optimal due to an inadequate volume of blood received in culture bottles Performed at [place] [address], [place], [place] [address]  Culture   Final    NO GROWTH 3 DAYS Performed at [facility], [place] [address] [place], [place] [address]    Report Status PENDING  Incomplete  Blood Culture (routine x 2)     Status: None (Preliminary result)   Collection Time: 04/13/2019  6:38 PM   Specimen: BLOOD  Result Value Ref Range Status   Specimen Description   Final    BLOOD RIGHT ANTECUBITAL Performed at [place] [address] [place], [place] [address]    Special Requests   Final    BOTTLES DRAWN AEROBIC AND ANAEROBIC Blood Culture results may not be optimal due to an inadequate volume of blood received in culture bottles Performed at [place] [address], [place], [place] [address]    Culture   Final    NO GROWTH 3 DAYS Performed at [facility], [place] [address], [place], [place] [address]    Report Status PENDING  Incomplete  Urine culture     Status: None   Collection Time: 04/03/2019   6:38 PM   Specimen: In/Out Cath Urine  Result Value Ref Range Status   Specimen Description   Final    IN/OUT CATH URINE Performed at [place] [address], [place], [place] [address]    Special Requests   Final    NONE Performed at [facility], [place] [address] [place], [place] [address]    Culture   Final    NO GROWTH Performed at [facility], [place] [address], [place], [place] [address]    Report Status 04/26/2019 FINAL  Final  SARS CORONAVIRUS 2 (TAT 6-24 HRS) Nasopharyngeal     Status: Abnormal   Collection Time: 04/24/2019  8:23 PM   Specimen: Nasopharyngeal  Result Value Ref Range Status   SARS Coronavirus 2 POSITIVE (A) NEGATIVE Final    Comment: RESULT CALLED TO, READ BACK BY AND VERIFIED WITH: Sharyn Creamer RN 11:50 04/26/19 ([email or web address]) (NOTE) SARS-CoV-2 target nucleic acids are NOT DETECTED. The SARS-CoV-2 RNA is generally detectable in upper and lower respiratory specimens during the acute phase of infection. Negative results do not preclude SARS-CoV-2 infection, do not rule out co-infections with other pathogens, and should not be used as the sole basis for treatment or other patient management decisions. Negative results must be combined with clinical observations, patient history, and epidemiological information. The expected result is Negative. Fact Sheet for Patients: SugarRoll.be Fact Sheet for Healthcare Providers: https://www.woods-mathews.com/ This test is not yet approved or cleared by the [place] FDA and  has been authorized for detection and/or diagnosis of SARS-CoV-2 by FDA under an Emergency Use Authorization (EUA). This EUA will remain  in effect (mea ning this test can be used) for the duration of the COVID-19 declaration under Section 564(b)(1) of the Act, 21 U.S.C. section 360bbb-3(b)(1), unless the authorization is terminated or revoked sooner. Performed  at [facility], [place] [address] [place], [place] [address]   MRSA PCR Screening     Status: Abnormal   Collection Time: 04/26/19  2:41 AM   Specimen: Nasal Mucosa; Nasopharyngeal  Result Value Ref Range Status   MRSA by PCR POSITIVE (A) NEGATIVE Final    Comment:        The GeneXpert MRSA Assay (FDA approved for NASAL specimens only), is one component of a comprehensive MRSA colonization surveillance program. It is not intended to diagnose MRSA infection nor to guide  or monitor treatment for MRSA infections. RESULT CALLED TO, READ BACK BY AND VERIFIED WITHJarrett Soho LETCHFORD @ [id number] ON 04/26/2019 Sandy Salaam Performed at [facility], [place] [address], [place], [place] [address]       Studies: No results found.  Scheduled Meds:  Chlorhexidine Gluconate Cloth  6 each Topical Daily    Continuous Infusions:  morphine 1 mg/hr (04/29/19 0800)     LOS: 4 days     Alma Friendly, MD [facility]  If 7PM-7AM, please contact night-coverage [email or web address] 04/29/2019, 5:31 PM

## 2019-04-30 ENCOUNTER — Encounter (HOSPITAL_COMMUNITY): Payer: Self-pay | Admitting: Family Medicine

## 2019-04-30 NOTE — Progress Notes (Signed)
PROGRESS NOTE  Andrea Shepard [id number] DOB: 02-09-1920 DOA: 04/23/2019 PCP: [name], MD  HPI/Recap of past 24 hours: HPI from Dr Cyd Shepard Andrea is a 86 y.o. female with medical history significant of dementia from nursing home sent in because of altered mental status and not acting her normal.  At patient's baseline she is ambulatory and interactive she had a marked decline in the last 24 hours. No history can be obtained from patient due to her dementia.  On arrival her temperature was found to be 95.5 with an elevated white count.  Patient thought to be septic of unclear source.  Of note, pt tested positive for Covid about 2 weeks ago per her daughter. She is been placed on a bear hugger and referred for admission for likely sepsis probably from UTI. Patient was admitted underwent hospitalization and further management found a very dehydrated dry on admission along with acute metabolic encephalopathy failure to thrive, COVID-19 infection AKI on ?CKD stage IV.  Patient was seen by palliative care.  Patient did not respond to trial of conservative intervention and was transitioned to comfort care.  Subjective Resting/minimally arousable some with touch,  Hr in 120s.appears comfortable  Assessment/Plan:  Acute metabolic encephalopathy Dehydration Failure to thrive Covid infection with a history of dementia Hypernatremia: Sodium 157 on admission Sepsis r/o likely severe dehydration- hypothermic on presentation, with leukocytosis, Procalcitonin negative.BC x2 NGTD. UA negative, UC no growth.Chest x-ray unremarkable AKI on ??CKD stage [id number] baseline creatinine in 2019 was 1.47 Elevated hemoglobin/hemoconcentration Known COVID-19 infection:Inflammatory markers fairly elevated Dementia Underweight Goals of care/Comfort measures:Patient DNR on presentation. Previous attending and palliative care spoke to daughter-given patient's very very poor prognosis, and baseline poor health  condition with dementia and patient did not improve with conservative measures patient was transitioned to comfort measures.   Code Status: DNR Family Communication: No family at bedside.  Discussed with the nursing staff.   Disposition Plan: Anticipate hospital death Consultants:Palliative Procedures:None Antimicrobials: off cefepime DVT prophylaxis:None, comfort care   Objective: Vitals:   04/30/19 0400 04/30/19 0500 04/30/19 0600 04/30/19 0646  BP: 91/75     Pulse: (!) 44 (!) 58    Resp: 18 19 15 17   TempSrc: Other (Comment)     SpO2: (!) 88% 93%    Weight:      Height:        Intake/Output Summary (Last 24 hours) at 04/30/2019 0840 Last data filed at 04/30/2019 0600 Gross per 24 hour  Intake --  Output 600 ml  Net -600 ml   Filed Weights   04/26/19 0235  Weight: 46.1 kg    Exam: General exam: Calm, comfortable, minimally responsive HEENT:Oral mucosa moist, Ear/Nose WNL grossly Respiratory system: breathing spontaneously, no use of accessory muscle, non tender on palpation. Cardiovascular system: regular rate and rhythm, S1 & S2 heard, No JVD/murmurs. Gastrointestinal system: Abdomen soft,  Nervous System: minimally responsive. Flexes UE with touch Extremities: No edema, distal peripheral pulses palpable.  Skin: No rashes,no icterus. MSK: Normal muscle bulk,tone, power   Data Reviewed: CBC: Recent Labs  Lab 04/15/2019 1838 04/26/2019 2206 04/26/19 0649 04/27/19 0251  WBC 16.5* 17.7* 16.3* 10.4  NEUTROABS 15.3*  --  14.6* 8.4*  HGB 20.4* 18.6* 15.5* 14.6  HCT 65.4* 59.2* 50.6* 48.4*  MCV 99.4 99.5 101.8* 100.8*  PLT 166 174 144* [id number]*   Basic Metabolic Panel: Recent Labs  Lab 04/20/2019 1838 04/18/2019 2206 04/26/19 0649 04/26/19 1659 04/27/19 0251  NA 157*  --  157*  159* 152*  K 4.6  --  4.0 4.3 4.1  CL 116*  --  124* 121* 116*  CO2 27  --  22 23 24   GLUCOSE 121*  --  114* 100* 105*  BUN 98*  --  92* 85* 73*  CREATININE 1.81* 1.87* 1.66* 1.70* 1.42*   CALCIUM 9.6  --  8.2* 8.5* 8.0*   GFR: Estimated Creatinine Clearance: 16.5 mL/min (A) (by C-G formula based on SCr of 1.42 mg/dL (H)). Liver Function Tests: Recent Labs  Lab 04/14/2019 1838 04/26/19 0649 04/27/19 0251  AST 57* 30 34  ALT 95* 59* 55*  ALKPHOS 80 54 48  BILITOT 0.9 0.9 1.0  PROT 7.7 5.5* 5.2*  ALBUMIN 4.0 2.9* 2.8*   No results for input(s): LIPASE, AMYLASE in the last 168 hours. No results for input(s): AMMONIA in the last 168 hours. Coagulation Profile: Recent Labs  Lab 04/02/2019 1838  INR 1.0   Cardiac Enzymes: No results for input(s): CKTOTAL, CKMB, CKMBINDEX, TROPONINI in the last 168 hours. BNP (last 3 results) No results for input(s): PROBNP in the last 8760 hours. HbA1C: No results for input(s): HGBA1C in the last 72 hours. CBG: No results for input(s): GLUCAP in the last 168 hours. Lipid Profile: No results for input(s): CHOL, HDL, LDLCALC, TRIG, CHOLHDL, LDLDIRECT in the last 72 hours. Thyroid Function Tests: No results for input(s): TSH, T4TOTAL, FREET4, T3FREE, THYROIDAB in the last 72 hours. Anemia Panel: No results for input(s): VITAMINB12, FOLATE, FERRITIN, TIBC, IRON, RETICCTPCT in the last 72 hours. Urine analysis:    Component Value Date/Time   COLORURINE YELLOW 04/01/2019 1838   APPEARANCEUR CLEAR 04/24/2019 1838   LABSPEC 1.011 03/30/2019 1838   PHURINE 5.0 04/20/2019 1838   GLUCOSEU NEGATIVE 04/05/2019 1838   HGBUR NEGATIVE 03/28/2019 1838   BILIRUBINUR NEGATIVE 03/29/2019 1838   Benjamin Stain NEGATIVE 04/03/2019 1838   PROTEINUR NEGATIVE 03/31/2019 1838   NITRITE NEGATIVE 04/16/2019 1838   LEUKOCYTESUR NEGATIVE 03/28/2019 1838   Sepsis Labs: @LABRCNTIP (procalcitonin:4,lacticidven:4)  ) Recent Results (from the past 240 hour(s))  Blood Culture (routine x 2)     Status: None (Preliminary result)   Collection Time: 04/14/2019  6:38 PM   Specimen: BLOOD  Result Value Ref Range Status   Specimen Description   Final    BLOOD  RIGHT ANTECUBITAL Performed at [facility], [place] [address] [place], [place] [address]    Special Requests   Final    BOTTLES DRAWN AEROBIC AND ANAEROBIC Blood Culture results may not be optimal due to an inadequate volume of blood received in culture bottles Performed at [place] [address], [place], [place] [address]    Culture   Final    NO GROWTH 4 DAYS Performed at [facility], [place] [address], [place], [place] [address]    Report Status PENDING  Incomplete  Blood Culture (routine x 2)     Status: None (Preliminary result)   Collection Time: 04/15/2019  6:38 PM   Specimen: BLOOD  Result Value Ref Range Status   Specimen Description   Final    BLOOD RIGHT ANTECUBITAL Performed at [place] [address], [place], [place] [address]    Special Requests   Final    BOTTLES DRAWN AEROBIC AND ANAEROBIC Blood Culture results may not be optimal due to an inadequate volume of blood received in culture bottles Performed at [place] [address] [place], [place] [address]  Culture   Final    NO GROWTH 4 DAYS Performed at [facility], [place] [address] [place] [address], [place], [place] [address]    Report Status PENDING  Incomplete  Urine culture     Status: None   Collection Time: 03/28/2019  6:38 PM   Specimen: In/Out Cath Urine  Result Value Ref Range Status   Specimen Description   Final    IN/OUT CATH URINE Performed at [place] [address], [place], [place] [address]    Special Requests   Final    NONE Performed at [facility], [place] [address], [place], [place] [address]    Culture   Final    NO GROWTH Performed at [facility], [place] [address] [place], [place] [address]    Report Status 04/26/2019 FINAL  Final  SARS CORONAVIRUS 2 (TAT 6-24 HRS) Nasopharyngeal     Status: Abnormal   Collection Time: 03/29/2019  8:23 PM   Specimen:  Nasopharyngeal  Result Value Ref Range Status   SARS Coronavirus 2 POSITIVE (A) NEGATIVE Final    Comment: RESULT CALLED TO, READ BACK BY AND VERIFIED WITH: Sharyn Creamer RN 11:50 04/26/19 ([email or web address]) (NOTE) SARS-CoV-2 target nucleic acids are NOT DETECTED. The SARS-CoV-2 RNA is generally detectable in upper and lower respiratory specimens during the acute phase of infection. Negative results do not preclude SARS-CoV-2 infection, do not rule out co-infections with other pathogens, and should not be used as the sole basis for treatment or other patient management decisions. Negative results must be combined with clinical observations, patient history, and epidemiological information. The expected result is Negative. Fact Sheet for Patients: SugarRoll.be Fact Sheet for Healthcare Providers: https://www.woods-mathews.com/ This test is not yet approved or cleared by the [place] FDA and  has been authorized for detection and/or diagnosis of SARS-CoV-2 by FDA under an Emergency Use Authorization (EUA). This EUA will remain  in effect (mea ning this test can be used) for the duration of the COVID-19 declaration under Section 564(b)(1) of the Act, 21 U.S.C. section 360bbb-3(b)(1), unless the authorization is terminated or revoked sooner. Performed at [facility], [place] [address], [place], [place] [address]   MRSA PCR Screening     Status: Abnormal   Collection Time: 04/26/19  2:41 AM   Specimen: Nasal Mucosa; Nasopharyngeal  Result Value Ref Range Status   MRSA by PCR POSITIVE (A) NEGATIVE Final    Comment:        The GeneXpert MRSA Assay (FDA approved for NASAL specimens only), is one component of a comprehensive MRSA colonization surveillance program. It is not intended to diagnose MRSA infection nor to guide or monitor treatment for MRSA infections. RESULT CALLED TO, READ BACK BY AND VERIFIED WITHJarrett Soho LETCHFORD @ [id number] ON  04/26/2019 Sandy Salaam Performed at [facility], [place] [address], [place], [place] [address]       Studies: No results found.  Scheduled Meds: . Chlorhexidine Gluconate Cloth  6 each Topical Daily    Continuous Infusions: . morphine 1 mg/hr (04/30/19 0757)     LOS: 5 days    Antonieta Pert, MD [facility]  If 7PM-7AM, please contact night-coverage [email or web address] 04/30/2019, 8:40 AM

## 2019-04-30 NOTE — Progress Notes (Signed)
Daily Progress Note   Patient Name: Andrea Shepard       Date: 04/30/2019 DOB: Sep 17, 1919  Age: 86 y.o. MRN#: [id number] Attending Physician: Antonieta Pert, MD Primary Care Physician: [name], MD Admit Date: 04/17/2019  Reason for Consultation/Follow-up: Psychosocial/spiritual support and Terminal Care  Subjective: Chart reviewed.  [name] appears to be resting comfortably on low-dose morphine infusion.  Exam is limited secondary to Covid 19 infection.  Length of Stay: 5  Current Medications: Scheduled Meds:  . Chlorhexidine Gluconate Cloth  6 each Topical Daily    Continuous Infusions: . morphine Stopped (04/30/19 1555)    PRN Meds: acetaminophen **OR** acetaminophen, antiseptic oral rinse, bisacodyl, diphenhydrAMINE, glycopyrrolate **OR** glycopyrrolate **OR** glycopyrrolate, haloperidol **OR** haloperidol **OR** haloperidol lactate, loperamide, LORazepam, morphine, morphine CONCENTRATE **OR** morphine CONCENTRATE, ondansetron **OR** ondansetron (ZOFRAN) IV, polyvinyl alcohol  Physical Exam   Somnolent. No respiratory distress. No picking or agitation.        Vital Signs: BP (!) 74/49   Pulse (!) 101   Temp 97.8 F (36.6 C) (Rectal)   Resp 12   Ht 5\' 3"  (1.6 m)   Wt 46.1 kg   SpO2 (!) 84%   BMI 18.00 kg/m  SpO2: SpO2: (!) 84 % O2 Device: O2 Device: Room Air O2 Flow Rate:    Intake/output summary:   Intake/Output Summary (Last 24 hours) at 04/30/2019 2207 Last data filed at 04/30/2019 1600 Gross per 24 hour  Intake 34.3 ml  Output 100 ml  Net -65.7 ml   LBM: Last BM Date: (UTA) Baseline Weight: Weight: 46.1 kg Most recent weight: Weight: 46.1 kg       Palliative Assessment/Data:    Flowsheet Rows     Most Recent Value  Intake Tab  Referral Department   Hospitalist  Unit at Time of Referral  Intermediate Care Unit  Palliative Care Primary Diagnosis  Sepsis/Infectious Disease  Date Notified  04/26/19  Palliative Care Type  New Palliative care  Reason for referral  Clarify Goals of Care, End of Life Care Assistance  Date of Admission  04/26/19  Date first seen by Palliative Care  04/27/19  # of days Palliative referral response time  1 Day(s)  # of days IP prior to Palliative referral  0  Clinical Assessment  Palliative Performance Scale Score  10%  Psychosocial &  Spiritual Assessment  Palliative Care Outcomes  Palliative Care Outcomes  Provided end of life care assistance      Patient Active Problem List   Diagnosis Date Noted  . Sepsis ([place]) 04/24/2019  . Dementia ([place]) 04/17/2019  . Acute metabolic encephalopathy [id number]  . AKI (acute kidney injury) ([place]) 04/05/2019  . Hypernatremia 04/07/2019  . Dehydration 04/04/2019    Palliative Care Assessment & Plan   Patient Profile: [name] is a 86 year old female with past medical history of dementia who presented from skilled facility.  She did not respond to trial of conservative interventions and was transitioned to focus on comfort care.  Recommendations/Plan:  Full comfort care.  Chart reviewed including use of as needed medications.  She appears comfortable on my exam today.  Continue same.  Goals of Care and Additional Recommendations:  Limitations on Scope of Treatment: Full Comfort Care  Code Status:    Code Status Orders  (From admission, onward)         Start     Ordered   04/27/19 1230  Do not attempt resuscitation (DNR)  Continuous    Question Answer Comment  In the event of cardiac or respiratory ARREST Do not call a "code blue"   In the event of cardiac or respiratory ARREST Do not perform Intubation, CPR, defibrillation or ACLS   In the event of cardiac or respiratory ARREST Use medication by any route, position, wound care, and other measures to  relive pain and suffering. May use oxygen, suction and manual treatment of airway obstruction as needed for comfort.      04/27/19 1235        Code Status History    Date Active Date Inactive Code Status Order ID Comments User Context   04/26/2019 0256 04/27/2019 1235 DNR [id number]  Phillips Grout, MD Inpatient   04/20/2019 2209 04/26/2019 0256 Full Code [id number]  Phillips Grout, MD ED   Advance Care Planning Activity       Prognosis:   Hours - Days  Discharge Planning:  Anticipated Hospital Death  Care plan was discussed with RN  Thank you for allowing the Palliative Medicine Team to assist in the care of this patient.   Total Time 20 Prolonged Time Billed No      Greater than 50%  of this time was spent counseling and coordinating care related to the above assessment and plan.  Micheline Rough, MD  Please contact Palliative Medicine Team phone at [phone] for questions and concerns.

## 2019-05-01 ENCOUNTER — Encounter (HOSPITAL_COMMUNITY): Payer: Self-pay | Admitting: Family Medicine

## 2019-05-01 LAB — CULTURE, BLOOD (ROUTINE X 2)
Culture: NO GROWTH
Culture: NO GROWTH

## 2019-05-01 NOTE — Progress Notes (Signed)
PROGRESS NOTE    Andrea Shepard  [id number] DOB: 03-17-1920 DOA: 04/11/2019 PCP: [name], MD (Confirm with patient/family/NH records and if not entered, this HAS to be entered at [facility] point of entry. "No PCP" if truly none.)   Brief Narrative:  HPI from Dr Cyd Silence Corumis a 86 y.o.femalewith medical history significant ofdementia from nursing home sent in because of altered mental status and not acting her normal. At patient's baseline she is ambulatory and interactive she had a marked decline in the last 24 hours. No history can be obtained from patient due to her dementia. On arrival her temperature was found to be 95.5 with an elevated white count. Patient thought to be septic of unclear source. Of note, pt tested positive for Covid about 2 weeks ago per her daughter. She is been placed on a bear hugger and referred for admission for likely sepsis probably from UTI. Patient was admitted underwent hospitalization and further management found a very dehydrated dry on admission along with acute metabolic encephalopathy failure to thrive, COVID-19 infection AKI on ?CKD stage IV.  Patient was seen by palliative care.  Patient did not respond to trial of conservative intervention and was transitioned to comfort care.  Subjective Resting/minimally arousable some with touch, appears comfortable. No other events today.   Assessment & Plan:   Principal Problem:   Sepsis ([place]) Active Problems:   Dementia ([place])   Acute metabolic encephalopathy   AKI (acute kidney injury) ([place])   Hypernatremia   Dehydration  Acute metabolic encephalopathy Dehydration Failure to thrive  Covid infection with a history of dementia  Hypernatremia: Sodium 157 on admission, no longer checking labs  Sepsis r/o likely severe dehydration- hypothermic on presentation, with leukocytosis, Procalcitonin negative.BC x2 NGTD. UA negative, UC no growth.Chest x-ray unremarkable  AKI on ??CKD stage  [id number] baseline creatinine in 2019 was 1.47 - No long checking labs, is comfort measures  Elevated hemoglobin/hemoconcentration - No long checking labs, is comfort measures  Known COVID-19 infection:Inflammatory markers fairly elevated - No long checking labs, is comfort measures  Dementia Underweight Goals of care/Comfort measures:Patient DNR on presentation. Previous attending and palliative care spoke to daughter-given patient's very very poor prognosis, and baseline poor health condition with dementia and patient did not improve with conservative measures patient was transitioned to comfort measures.  [place] Last care Team following  Code Status: DNR Family Communication: No family at bedside.  Discussed with the nursing staff.   Disposition Plan: Anticipate hospital death Consultants:Palliative Procedures:None Antimicrobials: N/A DVT prophylaxis:None, comfort care  Continue inpatient status given comfort care measures and covid status.   Objective: Vitals:   04/30/19 2025 05/01/19 0800 05/01/19 0900 05/01/19 1000  BP: (!) 74/49     Pulse: (!) 101 69 (!) 128 (!) 47  Resp: 12 (!) 27 (!) 26 16  TempSrc:      SpO2: (!) 84% (!) 85% (!) 83% (!) 85%  Weight:      Height:        Intake/Output Summary (Last 24 hours) at 05/01/2019 1332 Last data filed at 04/30/2019 1600 Gross per 24 hour  Intake 5.93 ml  Output --  Net 5.93 ml   Filed Weights   04/26/19 0235  Weight: 46.1 kg    Examination:  General exam: Appears comfortable  Respiratory system: Clear to auscultation. Respiratory effort normal. Slow shallow respirations.  Cardiovascular system: S1 & S2 heard, RRR. Gastrointestinal system: Abdomen is nondistended, soft and nontender. Central nervous system: Asleep Extremities: N/A Skin:  No rashes, lesions or ulcers Psychiatry: Unable to assess  Data Reviewed: I have personally reviewed following labs and imaging studies  CBC: Recent Labs  Lab 04/19/2019 1838  04/08/2019 2206 04/26/19 0649 04/27/19 0251  WBC 16.5* 17.7* 16.3* 10.4  NEUTROABS 15.3*  --  14.6* 8.4*  HGB 20.4* 18.6* 15.5* 14.6  HCT 65.4* 59.2* 50.6* 48.4*  MCV 99.4 99.5 101.8* 100.8*  PLT 166 174 144* [id number]*   Basic Metabolic Panel: Recent Labs  Lab 04/15/2019 1838 04/27/2019 2206 04/26/19 0649 04/26/19 1659 04/27/19 0251  NA 157*  --  157* 159* 152*  K 4.6  --  4.0 4.3 4.1  CL 116*  --  124* 121* 116*  CO2 27  --  22 23 24   GLUCOSE 121*  --  114* 100* 105*  BUN 98*  --  92* 85* 73*  CREATININE 1.81* 1.87* 1.66* 1.70* 1.42*  CALCIUM 9.6  --  8.2* 8.5* 8.0*   GFR: Estimated Creatinine Clearance: 16.5 mL/min (A) (by C-G formula based on SCr of 1.42 mg/dL (H)). Liver Function Tests: Recent Labs  Lab 04/20/2019 1838 04/26/19 0649 04/27/19 0251  AST 57* 30 34  ALT 95* 59* 55*  ALKPHOS 80 54 48  BILITOT 0.9 0.9 1.0  PROT 7.7 5.5* 5.2*  ALBUMIN 4.0 2.9* 2.8*   No results for input(s): LIPASE, AMYLASE in the last 168 hours. No results for input(s): AMMONIA in the last 168 hours. Coagulation Profile: Recent Labs  Lab 04/03/2019 1838  INR 1.0   Cardiac Enzymes: No results for input(s): CKTOTAL, CKMB, CKMBINDEX, TROPONINI in the last 168 hours. BNP (last 3 results) No results for input(s): PROBNP in the last 8760 hours. HbA1C: No results for input(s): HGBA1C in the last 72 hours. CBG: No results for input(s): GLUCAP in the last 168 hours. Lipid Profile: No results for input(s): CHOL, HDL, LDLCALC, TRIG, CHOLHDL, LDLDIRECT in the last 72 hours. Thyroid Function Tests: No results for input(s): TSH, T4TOTAL, FREET4, T3FREE, THYROIDAB in the last 72 hours. Anemia Panel: No results for input(s): VITAMINB12, FOLATE, FERRITIN, TIBC, IRON, RETICCTPCT in the last 72 hours. Sepsis Labs: Recent Labs  Lab 04/24/2019 1838 04/10/2019 2206 04/16/2019 2323 04/26/19 0830 04/26/19 1254  PROCALCITON  --  <0.10  --   --   --   LATICACIDVEN 2.3*  --  2.8* 2.1* 2.0*    Recent  Results (from the past 240 hour(s))  Blood Culture (routine x 2)     Status: None (Preliminary result)   Collection Time: 04/12/2019  6:38 PM   Specimen: BLOOD  Result Value Ref Range Status   Specimen Description   Final    BLOOD RIGHT ANTECUBITAL Performed at [facility], [place] [address] [place], [place] [address]    Special Requests   Final    BOTTLES DRAWN AEROBIC AND ANAEROBIC Blood Culture results may not be optimal due to an inadequate volume of blood received in culture bottles Performed at [place] [address] [place], [place] [address]    Culture   Final    NO GROWTH 4 DAYS Performed at [facility], [place] [address] [place], [place] [address]    Report Status PENDING  Incomplete  Blood Culture (routine x 2)     Status: None (Preliminary result)   Collection Time: 04/22/2019  6:38 PM   Specimen: BLOOD  Result Value Ref Range Status   Specimen Description   Final    BLOOD RIGHT ANTECUBITAL Performed  at [facility], [place] [address] [place], [place] [address]    Special Requests   Final    BOTTLES DRAWN AEROBIC AND ANAEROBIC Blood Culture results may not be optimal due to an inadequate volume of blood received in culture bottles Performed at [place] [address] [place], [place] [address]    Culture   Final    NO GROWTH 4 DAYS Performed at [facility], [place] [address] [place], [place] [address]    Report Status PENDING  Incomplete  Urine culture     Status: None   Collection Time: 04/23/2019  6:38 PM   Specimen: In/Out Cath Urine  Result Value Ref Range Status   Specimen Description   Final    IN/OUT CATH URINE Performed at [place] [address], [place], [place] [address]    Special Requests   Final    NONE Performed at [facility], [place] [address], [place], [place] [address]    Culture   Final    NO  GROWTH Performed at [facility], [place] [address] [place], [place] [address]    Report Status 04/26/2019 FINAL  Final  SARS CORONAVIRUS 2 (TAT 6-24 HRS) Nasopharyngeal     Status: Abnormal   Collection Time: 04/10/2019  8:23 PM   Specimen: Nasopharyngeal  Result Value Ref Range Status   SARS Coronavirus 2 POSITIVE (A) NEGATIVE Final    Comment: RESULT CALLED TO, READ BACK BY AND VERIFIED WITH: Sharyn Creamer RN 11:50 04/26/19 ([email or web address]) (NOTE) SARS-CoV-2 target nucleic acids are NOT DETECTED. The SARS-CoV-2 RNA is generally detectable in upper and lower respiratory specimens during the acute phase of infection. Negative results do not preclude SARS-CoV-2 infection, do not rule out co-infections with other pathogens, and should not be used as the sole basis for treatment or other patient management decisions. Negative results must be combined with clinical observations, patient history, and epidemiological information. The expected result is Negative. Fact Sheet for Patients: SugarRoll.be Fact Sheet for Healthcare Providers: https://www.woods-mathews.com/ This test is not yet approved or cleared by the [place] FDA and  has been authorized for detection and/or diagnosis of SARS-CoV-2 by FDA under an Emergency Use Authorization (EUA). This EUA will remain  in effect (mea ning this test can be used) for the duration of the COVID-19 declaration under Section 564(b)(1) of the Act, 21 U.S.C. section 360bbb-3(b)(1), unless the authorization is terminated or revoked sooner. Performed at [facility], [place] [address] [place], [place] [address]   MRSA PCR Screening     Status: Abnormal   Collection Time: 04/26/19  2:41 AM   Specimen: Nasal Mucosa; Nasopharyngeal  Result Value Ref Range Status   MRSA by PCR POSITIVE (A) NEGATIVE Final    Comment:        The GeneXpert MRSA Assay (FDA approved for NASAL specimens only), is one component  of a comprehensive MRSA colonization surveillance program. It is not intended to diagnose MRSA infection nor to guide or monitor treatment for MRSA infections. RESULT CALLED TO, READ BACK BY AND VERIFIED WITHJarrett Soho LETCHFORD @ [id number] ON 04/26/2019 Sandy Salaam Performed at [facility], [place] [address], [place], [place] [address]        Radiology Studies: No results found.   Scheduled Meds: . Chlorhexidine Gluconate Cloth  6 each Topical Daily   Continuous Infusions: . morphine Stopped (04/30/19 1555)     LOS: 6 days  Time spent: 25 miniutes   Arma Heading, MD [facility] Pager [phone]  If 7PM-7AM, please contact night-coverage [email or web address] Password [email or web address] 05/01/2019, 1:32 PM

## 2019-05-01 NOTE — Progress Notes (Signed)
Daily Progress Note   Patient Name: Andrea Shepard       Date: 05/01/2019 DOB: 1919-12-20  Age: 86 y.o. MRN#: [id number] Attending Physician: Arma Heading, MD Primary Care Physician: Andrea Brothers, MD Admit Date: 04/26/2019  Reason for Consultation/Follow-up: Psychosocial/spiritual support and Terminal Care  Subjective: Chart reviewed.  Andrea Shepard appears to be resting comfortably on low-dose morphine infusion.  Exam is limited secondary to Covid 19 infection.  I called and was able to reach her daughter, Andrea Shepard.  Discussed that her other appears comfortable to me on examination as well as plan for continued comfort care.  Answered all questions the best of my ability.  Length of Stay: 6  Current Medications: Scheduled Meds:  . Chlorhexidine Gluconate Cloth  6 each Topical Daily    Continuous Infusions: . morphine 3 mg/hr (05/01/19 1742)    PRN Meds: acetaminophen **OR** acetaminophen, antiseptic oral rinse, bisacodyl, diphenhydrAMINE, glycopyrrolate **OR** glycopyrrolate **OR** glycopyrrolate, haloperidol **OR** haloperidol **OR** haloperidol lactate, loperamide, LORazepam, morphine, morphine CONCENTRATE **OR** morphine CONCENTRATE, ondansetron **OR** ondansetron (ZOFRAN) IV, polyvinyl alcohol  Physical Exam   Somnolent. No respiratory distress. No picking or agitation.        Vital Signs: BP (!) 74/49   Pulse (!) 47   Temp 97.8 F (36.6 C) (Rectal)   Resp 16   Ht 5\' 3"  (1.6 m)   Wt 46.1 kg   SpO2 (!) 85%   BMI 18.00 kg/m  SpO2: SpO2: (!) 85 % O2 Device: O2 Device: Room Air O2 Flow Rate:    Intake/output summary:  No intake or output data in the 24 hours ending 05/01/19 1959 LBM: Last BM Date: (UTA) Baseline Weight: Weight: 46.1 kg Most recent weight: Weight: 46.1  kg       Palliative Assessment/Data:    Flowsheet Rows     Most Recent Value  Intake Tab  Referral Department  Hospitalist  Unit at Time of Referral  Intermediate Care Unit  Palliative Care Primary Diagnosis  Sepsis/Infectious Disease  Date Notified  04/26/19  Palliative Care Type  New Palliative care  Reason for referral  Clarify Goals of Care, End of Life Care Assistance  Date of Admission  04/26/19  Date first seen by Palliative Care  04/27/19  # of days Palliative referral response time  1 Day(s)  # of  days IP prior to Palliative referral  0  Clinical Assessment  Palliative Performance Scale Score  10%  Psychosocial & Spiritual Assessment  Palliative Care Outcomes  Palliative Care Outcomes  Provided end of life care assistance      Patient Active Problem List   Diagnosis Date Noted  . Sepsis ([place]) 04/07/2019  . Dementia ([place]) 04/24/2019  . Acute metabolic encephalopathy [id number]  . AKI (acute kidney injury) ([place]) 04/06/2019  . Hypernatremia 04/10/2019  . Dehydration 04/02/2019    Palliative Care Assessment & Plan   Patient Profile: Andrea Shepard is a 86 year old female with past medical history of dementia who presented from skilled facility.  She did not respond to trial of conservative interventions and was transitioned to focus on comfort care.  Recommendations/Plan:  Full comfort care.  Chart reviewed including use of as needed medications.  She appears comfortable on my exam today.  Continue same.  Discussed with the patient's daughter.  Goals of Care and Additional Recommendations:  Limitations on Scope of Treatment: Full Comfort Care  Code Status:    Code Status Orders  (From admission, onward)         Start     Ordered   04/27/19 1230  Do not attempt resuscitation (DNR)  Continuous    Question Answer Comment  In the event of cardiac or respiratory ARREST Do not call a "code blue"   In the event of cardiac or respiratory ARREST Do not perform  Intubation, CPR, defibrillation or ACLS   In the event of cardiac or respiratory ARREST Use medication by any route, position, wound care, and other measures to relive pain and suffering. May use oxygen, suction and manual treatment of airway obstruction as needed for comfort.      04/27/19 1235        Code Status History    Date Active Date Inactive Code Status Order ID Comments User Context   04/26/2019 0256 04/27/2019 1235 DNR [id number]  Phillips Grout, MD Inpatient   04/20/2019 2209 04/26/2019 0256 Full Code [id number]  Phillips Grout, MD ED   Advance Care Planning Activity       Prognosis:   Hours - Days  Discharge Planning:  Anticipated Hospital Death  Care plan was discussed with RN  Thank you for allowing the Palliative Medicine Team to assist in the care of this patient.   Total Time 20 Prolonged Time Billed No      Greater than 50%  of this time was spent counseling and coordinating care related to the above assessment and plan.  Micheline Rough, MD  Please contact Palliative Medicine Team phone at [phone] for questions and concerns.

## 2019-05-01 NOTE — Progress Notes (Signed)
At start of shift, morphine gtt running at 3 mg/hr. Order in [facility] was only for 1 mg/hr. Notified palliative medicine, and made MD aware of gtt rate. MD was okay with rate at 3 mg/hr, and changed order in [facility] for titration between 1-5mg /hr

## 2019-05-02 DIAGNOSIS — Z515 Encounter for palliative care: Secondary | ICD-10-CM

## 2019-05-02 DIAGNOSIS — R4182 Altered mental status, unspecified: Secondary | ICD-10-CM

## 2019-05-02 NOTE — Progress Notes (Signed)
Daily Progress Note   Patient Name: Andrea [place]       Date: 05/02/2019 DOB: May 31, 1919  Age: 86 y.o. MRN#: [id number] Attending Physician: Arma Heading, MD Primary Care Physician: [name], MD Admit Date: 04/06/2019  Reason for Consultation/Follow-up: Psychosocial/spiritual support and Terminal Care  Subjective: Chart reviewed.  Ms. Depass appears to be resting comfortably on low-dose morphine infusion.  Exam is limited secondary to Covid 19 infection.  Remains on comfort measures.   Length of Stay: 7  Current Medications: Scheduled Meds:  . Chlorhexidine Gluconate Cloth  6 each Topical Daily    Continuous Infusions: . morphine 3 mg/hr (05/02/19 0400)    PRN Meds: acetaminophen **OR** acetaminophen, antiseptic oral rinse, bisacodyl, diphenhydrAMINE, glycopyrrolate **OR** glycopyrrolate **OR** glycopyrrolate, haloperidol **OR** haloperidol **OR** haloperidol lactate, loperamide, LORazepam, morphine, morphine CONCENTRATE **OR** morphine CONCENTRATE, ondansetron **OR** ondansetron (ZOFRAN) IV, polyvinyl alcohol  Physical Exam   Somnolent. No respiratory distress. No picking or agitation.        Vital Signs: BP (!) 74/49   Pulse (!) 25   Temp 97.8 F (36.6 C) (Rectal)   Resp (!) 22   Ht 5\' 3"  (1.6 m)   Wt 46.1 kg   SpO2 (!) 85%   BMI 18.00 kg/m  SpO2: SpO2: (!) 85 % O2 Device: O2 Device: Room Air O2 Flow Rate:    Intake/output summary:   Intake/Output Summary (Last 24 hours) at 05/02/2019 1359 Last data filed at 05/02/2019 0400 Gross per 24 hour  Intake 62.39 ml  Output 0 ml  Net 62.39 ml   LBM: Last BM Date: (UTA) Baseline Weight: Weight: 46.1 kg Most recent weight: Weight: 46.1 kg       Palliative Assessment/Data:    Flowsheet Rows     Most Recent  Value  Intake Tab  Referral Department  Hospitalist  Unit at Time of Referral  Intermediate Care Unit  Palliative Care Primary Diagnosis  Sepsis/Infectious Disease  Date Notified  04/26/19  Palliative Care Type  New Palliative care  Reason for referral  Clarify Goals of Care, End of Life Care Assistance  Date of Admission  04/26/19  Date first seen by Palliative Care  04/27/19  # of days Palliative referral response time  1 Day(s)  # of days IP prior to Palliative referral  0  Clinical Assessment  Palliative Performance Scale Score  10%  Psychosocial & Spiritual Assessment  Palliative Care Outcomes  Palliative Care Outcomes  Provided end of life care assistance      Patient Active Problem List   Diagnosis Date Noted  . Sepsis ([place]) 04/04/2019  . Dementia ([place]) 04/03/2019  . Acute metabolic encephalopathy [id number]  . AKI (acute kidney injury) ([place]) 04/08/2019  . Hypernatremia 03/29/2019  . Dehydration 04/24/2019    Palliative Care Assessment & Plan   Patient Profile: [name] is a 86 year old female with past medical history of dementia who presented from skilled facility.  She did not respond to trial of conservative interventions and was transitioned to focus on comfort care.  Recommendations/Plan:  Full comfort care.  Chart reviewed including use of as needed medications.  She appears comfortable on my exam today.  Continue same.   Goals of Care and Additional Recommendations:  Limitations on Scope of Treatment: Full Comfort Care  Code Status:    Code Status Orders  (From admission, onward)         Start     Ordered   04/27/19 1230  Do not attempt resuscitation (DNR)  Continuous    Question Answer Comment  In the event of cardiac or respiratory ARREST Do not call a "code blue"   In the event of cardiac or respiratory ARREST Do not perform Intubation, CPR, defibrillation or ACLS   In the event of cardiac or respiratory ARREST Use medication by any route,  position, wound care, and other measures to relive pain and suffering. May use oxygen, suction and manual treatment of airway obstruction as needed for comfort.      04/27/19 1235        Code Status History    Date Active Date Inactive Code Status Order ID Comments User Context   04/26/2019 0256 04/27/2019 1235 DNR [id number]  Phillips Grout, MD Inpatient   04/24/2019 2209 04/26/2019 0256 Full Code [id number]  Phillips Grout, MD ED   Advance Care Planning Activity       Prognosis:   Hours - Days  Discharge Planning:  Anticipated Hospital Death  Care plan was discussed with RN  Thank you for allowing the Palliative Medicine Team to assist in the care of this patient.   Total Time 15 Prolonged Time Billed No      Greater than 50%  of this time was spent counseling and coordinating care related to the above assessment and plan.  Loistine Chance, MD  Please contact Palliative Medicine Team phone at [phone] for questions and concerns.

## 2019-05-02 NOTE — Progress Notes (Signed)
PROGRESS NOTE    Andrea [place]  [id number] DOB: Dec 16, 1919 DOA: 04/10/2019 PCP: [name], MD (Confirm with patient/family/NH records and if not entered, this HAS to be entered at [facility] point of entry. "No PCP" if truly none.)   Brief Narrative:  HPI from Dr Cyd Silence Corumis a 86 y.o.femalewith medical history significant ofdementia from nursing home sent in because of altered mental status and not acting her normal. At patient's baseline she is ambulatory and interactive she had a marked decline in the last 24 hours. No history can be obtained from patient due to her dementia. On arrival her temperature was found to be 95.5 with an elevated white count. Patient thought to be septic of unclear source. Of note, pt tested positive for Covid about 2 weeks ago per her daughter. She is been placed on a bear hugger and referred for admission for likely sepsis probably from UTI. Patient was admitted underwent hospitalization and further management found a very dehydrated dry on admission along with acute metabolic encephalopathy failure to thrive, COVID-19 infection AKI on ?CKD stage IV.  Patient was seen by palliative care.  Patient did not respond to trial of conservative intervention and was transitioned to comfort care.  Subjective Resting/minimally arousable, appears comfortable. No other events today. Called and spoke with the daughter and updated on care plan. No changes at this time. All questions answered. She may come in and see her mother. Otherwise no changes.   Assessment & Plan:   Principal Problem:   Sepsis ([place]) Active Problems:   Dementia ([place])   Acute metabolic encephalopathy   AKI (acute kidney injury) ([place])   Hypernatremia   Dehydration  Acute metabolic encephalopathy Dehydration Failure to thrive  Covid infection with a history of dementia  Hypernatremia: Sodium 157 on admission, no longer checking labs  Sepsis r/o likely severe dehydration-  hypothermic on presentation, with leukocytosis, Procalcitonin negative.BC x2 NGTD. UA negative, UC no growth.Chest x-ray unremarkable  AKI on ??CKD stage [id number] baseline creatinine in 2019 was 1.47 - No longer checking labs, is comfort measures  Elevated hemoglobin/hemoconcentration - No longer checking labs, is comfort measures  Known COVID-19 infection:Inflammatory markers fairly elevated - No longer checking labs, is comfort measures  Dementia Underweight Goals of care/Comfort measures:Patient DNR on presentation. Previous attending and palliative care spoke to daughter-given patient's very very poor prognosis, and baseline poor health condition with dementia and patient did not improve with conservative measures patient was transitioned to comfort measures.  Berlinda Last care Team following  Code Status: DNR Family Communication: No family at bedside.  Discussed with the nursing staff.   Disposition Plan: Anticipate hospital death Consultants:Palliative Procedures:None Antimicrobials: N/A DVT prophylaxis:None, comfort care  Continue inpatient status given comfort care measures and covid status.   Objective: Vitals:   05/01/19 0800 05/01/19 0900 05/01/19 1000 05/01/19 2000  BP:      Pulse: 69 (!) 128 (!) 47 (!) 25  Resp: (!) 27 (!) 26 16 (!) 22  TempSrc:      SpO2: (!) 85% (!) 83% (!) 85% (!) 85%  Weight:      Height:        Intake/Output Summary (Last 24 hours) at 05/02/2019 1054 Last data filed at 05/02/2019 0400 Gross per 24 hour  Intake 62.39 ml  Output 0 ml  Net 62.39 ml   Filed Weights   04/26/19 0235  Weight: 46.1 kg    Examination:  General exam: Appears comfortable, as;eep  Respiratory system: Clear to auscultation.  Respiratory effort normal. Slow shallow respirations.  Cardiovascular system: S1 & S2 heard, RRR. Gastrointestinal system: Abdomen is nondistended, soft and nontender. Central nervous system: Asleep Extremities: N/A Skin: No rashes, lesions or  ulcers Psychiatry: Unable to assess  Data Reviewed: I have personally reviewed following labs and imaging studies  CBC: Recent Labs  Lab 04/04/2019 1838 04/08/2019 2206 04/26/19 0649 04/27/19 0251  WBC 16.5* 17.7* 16.3* 10.4  NEUTROABS 15.3*  --  14.6* 8.4*  HGB 20.4* 18.6* 15.5* 14.6  HCT 65.4* 59.2* 50.6* 48.4*  MCV 99.4 99.5 101.8* 100.8*  PLT 166 174 144* [id number]*   Basic Metabolic Panel: Recent Labs  Lab 03/31/2019 1838 04/14/2019 2206 04/26/19 0649 04/26/19 1659 04/27/19 0251  NA 157*  --  157* 159* 152*  K 4.6  --  4.0 4.3 4.1  CL 116*  --  124* 121* 116*  CO2 27  --  22 23 24   GLUCOSE 121*  --  114* 100* 105*  BUN 98*  --  92* 85* 73*  CREATININE 1.81* 1.87* 1.66* 1.70* 1.42*  CALCIUM 9.6  --  8.2* 8.5* 8.0*   GFR: Estimated Creatinine Clearance: 16.5 mL/min (A) (by C-G formula based on SCr of 1.42 mg/dL (H)). Liver Function Tests: Recent Labs  Lab 04/05/2019 1838 04/26/19 0649 04/27/19 0251  AST 57* 30 34  ALT 95* 59* 55*  ALKPHOS 80 54 48  BILITOT 0.9 0.9 1.0  PROT 7.7 5.5* 5.2*  ALBUMIN 4.0 2.9* 2.8*   No results for input(s): LIPASE, AMYLASE in the last 168 hours. No results for input(s): AMMONIA in the last 168 hours. Coagulation Profile: Recent Labs  Lab 04/08/2019 1838  INR 1.0   Cardiac Enzymes: No results for input(s): CKTOTAL, CKMB, CKMBINDEX, TROPONINI in the last 168 hours. BNP (last 3 results) No results for input(s): PROBNP in the last 8760 hours. HbA1C: No results for input(s): HGBA1C in the last 72 hours. CBG: No results for input(s): GLUCAP in the last 168 hours. Lipid Profile: No results for input(s): CHOL, HDL, LDLCALC, TRIG, CHOLHDL, LDLDIRECT in the last 72 hours. Thyroid Function Tests: No results for input(s): TSH, T4TOTAL, FREET4, T3FREE, THYROIDAB in the last 72 hours. Anemia Panel: No results for input(s): VITAMINB12, FOLATE, FERRITIN, TIBC, IRON, RETICCTPCT in the last 72 hours. Sepsis Labs: Recent Labs  Lab 04/17/2019 1838  04/02/2019 2206 03/28/2019 2323 04/26/19 0830 04/26/19 1254  PROCALCITON  --  <0.10  --   --   --   LATICACIDVEN 2.3*  --  2.8* 2.1* 2.0*    Recent Results (from the past 240 hour(s))  Blood Culture (routine x 2)     Status: None   Collection Time: 03/29/2019  6:38 PM   Specimen: BLOOD  Result Value Ref Range Status   Specimen Description   Final    BLOOD RIGHT ANTECUBITAL Performed at [place] [address] [place], [place] [address]    Special Requests   Final    BOTTLES DRAWN AEROBIC AND ANAEROBIC Blood Culture results may not be optimal due to an inadequate volume of blood received in culture bottles Performed at [place] [address], [place], [place] [address]    Culture   Final    NO GROWTH 5 DAYS Performed at [facility], [place] [address] [place], [place] [address]    Report Status 05/01/2019 FINAL  Final  Blood Culture (routine x 2)     Status: None   Collection Time: 04/17/2019  6:38 PM   Specimen: BLOOD  Result Value Ref Range Status   Specimen Description   Final    BLOOD RIGHT ANTECUBITAL Performed at [facility] [address], [place], [place] [address]    Special Requests   Final    BOTTLES DRAWN AEROBIC AND ANAEROBIC Blood Culture results may not be optimal due to an inadequate volume of blood received in culture bottles Performed at [place] [address] [place], [place] [address]    Culture   Final    NO GROWTH 5 DAYS Performed at [facility], [place] [address], [place], [place] [address]    Report Status 05/01/2019 FINAL  Final  Urine culture     Status: None   Collection Time: 03/30/2019  6:38 PM   Specimen: In/Out Cath Urine  Result Value Ref Range Status   Specimen Description   Final    IN/OUT CATH URINE Performed at [facility] [address], [place], [place] [address]    Special Requests   Final    NONE Performed at  [facility], [place] [address], [place], [place] [address]    Culture   Final    NO GROWTH Performed at [facility], [place] [address], [place], [place] [address]    Report Status 04/26/2019 FINAL  Final  SARS CORONAVIRUS 2 (TAT 6-24 HRS) Nasopharyngeal     Status: Abnormal   Collection Time: 04/26/2019  8:23 PM   Specimen: Nasopharyngeal  Result Value Ref Range Status   SARS Coronavirus 2 POSITIVE (A) NEGATIVE Final    Comment: RESULT CALLED TO, READ BACK BY AND VERIFIED WITH: Sharyn Creamer RN 11:50 04/26/19 ([email or web address]) (NOTE) SARS-CoV-2 target nucleic acids are NOT DETECTED. The SARS-CoV-2 RNA is generally detectable in upper and lower respiratory specimens during the acute phase of infection. Negative results do not preclude SARS-CoV-2 infection, do not rule out co-infections with other pathogens, and should not be used as the sole basis for treatment or other patient management decisions. Negative results must be combined with clinical observations, patient history, and epidemiological information. The expected result is Negative. Fact Sheet for Patients: SugarRoll.be Fact Sheet for Healthcare Providers: https://www.woods-mathews.com/ This test is not yet approved or cleared by the [place] FDA and  has been authorized for detection and/or diagnosis of SARS-CoV-2 by FDA under an Emergency Use Authorization (EUA). This EUA will remain  in effect (mea ning this test can be used) for the duration of the COVID-19 declaration under Section 564(b)(1) of the Act, 21 U.S.C. section 360bbb-3(b)(1), unless the authorization is terminated or revoked sooner. Performed at [facility], [place] [address] [place], [place] [address]   MRSA PCR Screening     Status: Abnormal   Collection Time: 04/26/19  2:41 AM   Specimen: Nasal Mucosa; Nasopharyngeal  Result Value Ref Range Status   MRSA by PCR POSITIVE (A) NEGATIVE Final     Comment:        The GeneXpert MRSA Assay (FDA approved for NASAL specimens only), is one component of a comprehensive MRSA colonization surveillance program. It is not intended to diagnose MRSA infection nor to guide or monitor treatment for MRSA infections. RESULT CALLED TO, READ BACK BY AND VERIFIED WITHJarrett Soho LETCHFORD @ [id number] ON 04/26/2019 Sandy Salaam Performed at [facility], [place] [address], [place], [place] [address]        Radiology Studies: No results found.   Scheduled  Meds: . Chlorhexidine Gluconate Cloth  6 each Topical Daily   Continuous Infusions: . morphine 3 mg/hr (05/02/19 0400)     LOS: 7 days    Time spent: 25 miniutes   Arma Heading, MD [facility] Pager [phone]  If 7PM-7AM, please contact night-coverage [email or web address] Password [facility] 05/02/2019, 10:54 AM

## 2019-05-29 NOTE — Death Summary Note (Signed)
Death Summary  Andrea Shepard [id number] DOB: 08-02-1919 DOA: 2019-05-18  PCP: [name], MD  Admit date: 2019-05-18 Date of Death: 2019-05-26 Time of Death: 12-Aug-2114 Notification: [name], MD notified of death of 05/27/19   History of present illness:  Andrea Shepard is a 86 y.o. female with medical history significant of dementia from nursing home sent in because of altered mental status and not acting her normal.  At patient's baseline she is ambulatory and interactive she had a marked decline in the last 24 hours.  No history can be obtained from patient due to her dementia.  On arrival her temperature was found to be 95.5 with an elevated white count.  Patient thought to be septic of unclear source.  Her Covid screen is pending.  She is been placed on a bear hugger and referred for admission for likely sepsis probably from UTI.   Andrea Shepard presented with complaint of Confusion  Andrea Shepard did not improve after IV fluids, treatment for Covid 19 PNA, and oxygen therapy. Was admitted underwent hospitalization and further management found a very dehydrated dry on admission along with acute metabolic encephalopathy failure to thrive, COVID-19 infection AKIon ?CKD stage IV.Patient was seen by palliative care. Patient did not respond to trial of conservative intervention and was transitioned to comfort care.  Final Diagnoses:  1. Covid 19 Pneumonia 2. Failure to thrive 3. AKI on CKD IV 4. Dementia   The results of significant diagnostics from this hospitalization (including imaging, microbiology, ancillary and laboratory) are listed below for reference.    Significant Diagnostic Studies: CT Abdomen Pelvis Wo Contrast  Result Date: May 18, 2019 CLINICAL DATA:  Weakness. EXAM: CT ABDOMEN AND PELVIS WITHOUT CONTRAST TECHNIQUE: Multidetector CT imaging of the abdomen and pelvis was performed following the standard protocol without IV contrast. COMPARISON:  None. FINDINGS: Lower chest:  No acute abnormality. Scarring/atelectasis at the right lung base. Hepatobiliary: No focal liver abnormality. Status post cholecystectomy. Moderate intra and extrahepatic biliary dilatation. Pancreas: Atrophic. No ductal dilatation or surrounding inflammatory changes. Spleen: Normal in size without focal abnormality. Adrenals/Urinary Tract: The adrenal glands are unremarkable. Left greater than right renal atrophy. 1.4 cm hyperdense lesion in the upper pole of the left kidney likely a proteinaceous or hemorrhagic cyst. No renal calculi or hydronephrosis. Moderately distended bladder without wall thickening. Stomach/Bowel: Stomach is within normal limits. Appendix appears normal. No evidence of bowel wall thickening, distention, or inflammatory changes. Left-sided colonic diverticulosis. Vascular/Lymphatic: Aortic atherosclerosis. No enlarged abdominal or pelvic lymph nodes. Reproductive: Uterus and bilateral adnexa are unremarkable. Other: No abdominal wall hernia or abnormality. No abdominopelvic ascites. No pneumoperitoneum. Musculoskeletal: No acute or significant osseous findings. IMPRESSION: 1.  No acute intra-abdominal process. 2. Moderate intra and extrahepatic biliary dilatation may be related to post cholecystectomy state. Correlate with LFTs. 3. Moderately distended bladder.  Correlate for urinary retention. 4.  Aortic atherosclerosis (ICD10-I70.0). Electronically Signed   By: Titus Dubin M.D.   On: 05/18/19 19:28   CT Head Wo Contrast  Result Date: May 18, 2019 CLINICAL DATA:  Altered mental status EXAM: CT HEAD WITHOUT CONTRAST TECHNIQUE: Contiguous axial images were obtained from the base of the skull through the vertex without intravenous contrast. COMPARISON:  None. FINDINGS: Brain: No evidence of acute infarction, hemorrhage, extra-axial collection, ventriculomegaly, or mass effect. 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 Mild encephalomalacia of the right  temporal lobe. Generalized cerebral atrophy. Periventricular white matter low attenuation likely secondary to microangiopathy. Vascular: Cerebrovascular atherosclerotic calcifications are noted. Skull: Negative for fracture or  focal lesion. Sinuses/Orbits: Visualized portions of the orbits are unremarkable. Visualized portions of the paranasal sinuses are unremarkable. Visualized portions of the mastoid air cells are unremarkable. Other: None. IMPRESSION: 1.   1.  No acute intracranial pathology. 2. Chronic microvascular disease and cerebral atrophy. 3. 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 Mild encephalomalacia of the right temporal lobe. Electronically Signed   By: Kathreen Devoid   On: 04/12/2019 19:24   DG Chest Port 1 View  Result Date: 04/20/2019 CLINICAL DATA:  Acute altered mental status and weakness. EXAM: PORTABLE CHEST 1 VIEW COMPARISON:  01/21/2018 FINDINGS: Stable mild cardiomegaly. Aortic atherosclerosis incidentally noted. Mild scarring is seen in the right lung base. No evidence of pulmonary infiltrate or edema. No evidence of pneumothorax or pleural effusion. IMPRESSION: Mild cardiomegaly.  No active lung disease. Electronically Signed   By: Marlaine Hind M.D.   On: 04/15/2019 19:12    Microbiology: Recent Results (from the past 240 hour(s))  Blood Culture (routine x 2)     Status: None   Collection Time: 04/10/2019  6:38 PM   Specimen: BLOOD  Result Value Ref Range Status   Specimen Description   Final    BLOOD RIGHT ANTECUBITAL Performed at [facility] [address] [place], [place] [address]    Special Requests   Final    BOTTLES DRAWN AEROBIC AND ANAEROBIC Blood Culture results may not be optimal due to an inadequate volume of blood received in culture bottles Performed at [facility] [address] [place], [place] [address]    Culture   Final    NO GROWTH 5 DAYS Performed at [facility],  [place] [address] [place], [place] [address]    Report Status 05/01/2019 FINAL  Final  Blood Culture (routine x 2)     Status: None   Collection Time: 04/02/2019  6:38 PM   Specimen: BLOOD  Result Value Ref Range Status   Specimen Description   Final    BLOOD RIGHT ANTECUBITAL Performed at [facility] [address] [place], [place] [address]    Special Requests   Final    BOTTLES DRAWN AEROBIC AND ANAEROBIC Blood Culture results may not be optimal due to an inadequate volume of blood received in culture bottles Performed at [facility] [address] [place], [place] [address]    Culture   Final    NO GROWTH 5 DAYS Performed at [facility], [place] [address] [place], [place] [address]    Report Status 05/01/2019 FINAL  Final  Urine culture     Status: None   Collection Time: 04/06/2019  6:38 PM   Specimen: In/Out Cath Urine  Result Value Ref Range Status   Specimen Description   Final    IN/OUT CATH URINE Performed at [facility] [address] [place], [place] [address]    Special Requests   Final    NONE Performed at [facility], [place] [address] [place], [place] [address]    Culture   Final    NO GROWTH Performed at [facility], [place] [address], [place], [place] [address]    Report Status 04/26/2019 FINAL  Final  SARS CORONAVIRUS 2 (TAT 6-24 HRS) Nasopharyngeal     Status: Abnormal   Collection Time: 03/30/2019  8:23 PM   Specimen: Nasopharyngeal  Result Value Ref Range Status   SARS Coronavirus 2 POSITIVE (A) NEGATIVE Final  Comment: RESULT CALLED TO, READ BACK BY AND VERIFIED WITH: Sharyn Creamer RN 11:50 04/26/19 ([email or web address]) (NOTE) SARS-CoV-2 target nucleic acids are NOT DETECTED. The SARS-CoV-2 RNA is generally detectable in upper and lower respiratory specimens during the acute phase of infection. Negative results do not preclude SARS-CoV-2 infection, do not rule out co-infections  with other pathogens, and should not be used as the sole basis for treatment or other patient management decisions. Negative results must be combined with clinical observations, patient history, and epidemiological information. The expected result is Negative. Fact Sheet for Patients: SugarRoll.be Fact Sheet for Healthcare Providers: https://www.woods-mathews.com/ This test is not yet approved or cleared by the [place] FDA and  has been authorized for detection and/or diagnosis of SARS-CoV-2 by FDA under an Emergency Use Authorization (EUA). This EUA will remain  in effect (mea ning this test can be used) for the duration of the COVID-19 declaration under Section 564(b)(1) of the Act, 21 U.S.C. section 360bbb-3(b)(1), unless the authorization is terminated or revoked sooner. Performed at [facility], [place] [address], [place], [place] [address]   MRSA PCR Screening     Status: Abnormal   Collection Time: 04/26/19  2:41 AM   Specimen: Nasal Mucosa; Nasopharyngeal  Result Value Ref Range Status   MRSA by PCR POSITIVE (A) NEGATIVE Final    Comment:        The GeneXpert MRSA Assay (FDA approved for NASAL specimens only), is one component of a comprehensive MRSA colonization surveillance program. It is not intended to diagnose MRSA infection nor to guide or monitor treatment for MRSA infections. RESULT CALLED TO, READ BACK BY AND VERIFIED WITHJarrett Soho LETCHFORD @ [id number] ON 04/26/2019 Sandy Salaam Performed at [facility], [place], [place] [address], [place], [place] [address]      Labs: Basic Metabolic Panel: No results for input(s): NA, K, CL, CO2, GLUCOSE, BUN, CREATININE, CALCIUM, MG, PHOS in the last 168 hours. Liver Function Tests: No results for input(s): AST, ALT, ALKPHOS, BILITOT, PROT, ALBUMIN in the last 168 hours. No results for input(s): LIPASE, AMYLASE in the last 168 hours. No results for input(s): AMMONIA in  the last 168 hours. CBC: No results for input(s): WBC, NEUTROABS, HGB, HCT, MCV, PLT in the last 168 hours. Cardiac Enzymes: No results for input(s): CKTOTAL, CKMB, CKMBINDEX, TROPONINI in the last 168 hours. D-Dimer No results for input(s): DDIMER in the last 72 hours. BNP: Invalid input(s): POCBNP CBG: No results for input(s): GLUCAP in the last 168 hours. Anemia work up No results for input(s): VITAMINB12, FOLATE, FERRITIN, TIBC, IRON, RETICCTPCT in the last 72 hours. Urinalysis    Component Value Date/Time   COLORURINE YELLOW 04/19/2019 1838   APPEARANCEUR CLEAR 04/08/2019 1838   LABSPEC 1.011 03/31/2019 1838   PHURINE 5.0 04/20/2019 1838   GLUCOSEU NEGATIVE 04/24/2019 1838   HGBUR NEGATIVE 03/28/2019 1838   BILIRUBINUR NEGATIVE 04/08/2019 1838   KETONESUR NEGATIVE 04/16/2019 1838   PROTEINUR NEGATIVE 04/13/2019 1838   NITRITE NEGATIVE 04/24/2019 1838   LEUKOCYTESUR NEGATIVE 04/05/2019 1838   Sepsis Labs Invalid input(s): PROCALCITONIN,  WBC,  LACTICIDVEN   SIGNED:  Arma Heading, MD  [facility] 05/04/2019, 3:23 PM  If 7PM-7AM, please contact night-coverage [email or web address] Password [email or web address]

## 2019-05-29 NOTE — Progress Notes (Signed)
Morphine Drip wasted in stericycle 60mg (48ml) with Osa Craver. Pt. Discharged out of the system, unable to waste in pyxis 05/04/2019 0456

## 2019-05-29 NOTE — Progress Notes (Signed)
Called and updated patients Daughter Zane Herald [phone]) on patient condition and possibility of transferring patient to another unit.

## 2019-05-29 NOTE — TOC Progression Note (Signed)
Transition of Care [facility]) - Progression Note    Patient Details  Name: Kashtyn Trillo MRN: [id number] Date of Birth: 09-Oct-1919  Transition of [facility]) CM/SW Contact  Purcell Mouton, RN Phone Number: 05-06-19, 3:40 PM  Clinical Narrative:     Pt admitted from [place]. Anticipated Hospital Death/MD notes.       Expected Discharge Plan and Services                                                 Social Determinants of Health (SDOH) Interventions    Readmission Risk Interventions No flowsheet data found.

## 2019-05-29 NOTE — Progress Notes (Signed)
RN called because pt expired at 07-23-2114 hours. Pronounced by 2 RNs. Death was anticipated and pt was a DNR and comfort care only. Death certificate given to [place] on 4E and asked her to tube it to 5W.  KJKG, NP [facility]

## 2019-05-29 NOTE — Progress Notes (Signed)
PROGRESS NOTE    Andrea Shepard  [id number] DOB: 11-09-1919 DOA: 04/03/2019 PCP: [name], MD   Brief Narrative:  HPI from Dr Cyd Silence Corumis a 86 y.o.femalewith medical history significant ofdementia from nursing home sent in because of altered mental status and not acting her normal. At patient's baseline she is ambulatory and interactive she had a marked decline in the last 24 hours. No history can be obtained from patient due to her dementia. On arrival her temperature was found to be 95.5 with an elevated white count. Patient thought to be septic of unclear source. Of note, pt tested positive for Covid about 2 weeks ago per her daughter. She is been placed on a bear hugger and referred for admission for likely sepsis probably from UTI. Patient was admitted underwent hospitalization and further management found a very dehydrated dry on admission along with acute metabolic encephalopathy failure to thrive, COVID-19 infection AKI on ?CKD stage IV.  Patient was seen by palliative care.  Patient did not respond to trial of conservative intervention and was transitioned to comfort care.  Subjective Not arousable today, appears comfortable.No changes at this time. Daughter was planning to come see the patient yesterday and did so. Patient only  miniamlly breathing.   Assessment & Plan:   Principal Problem:   Sepsis ([place]) Active Problems:   Dementia ([place])   Acute metabolic encephalopathy   AKI (acute kidney injury) ([place])   Hypernatremia   Dehydration   Altered mental status   Dying care   Palliative care by specialist  Acute metabolic encephalopathy Dehydration Failure to thrive  Covid infection with a history of dementia  Hypernatremia: Sodium 157 on admission, no longer checking labs  Sepsis r/o likely severe dehydration- hypothermic on presentation, with leukocytosis, Procalcitonin negative.BC x2 NGTD. UA negative, UC no growth.Chest x-ray unremarkable -  Continue comfort measures  AKI on ??CKD stage [id number] baseline creatinine in 2019 was 1.47 - No longer checking labs, is comfort measures  Elevated hemoglobin/hemoconcentration - No longer checking labs, is comfort measures  Known COVID-19 infection:Inflammatory markers fairly elevated - No longer checking labs, is comfort measures  Dementia Underweight Goals of care/Comfort measures:Patient DNR on presentation. Previous attending and palliative care spoke to daughter-given patient's very very poor prognosis, and baseline poor health condition with dementia and patient did not improve with conservative measures patient was transitioned to comfort measures.  [place] Last care Team following  Code Status: DNR Family Communication: No family at bedside.  Discussed with the nursing staff.   Disposition Plan: Anticipate hospital death Consultants:Palliative Procedures:None Antimicrobials: N/A DVT prophylaxis:None, comfort care  Continue inpatient status given comfort care measures and covid status.   Objective: Vitals:   05/02/19 1900 05/02/19 2000 05/16/2019 0858 May 16, 2019 1114  BP:   (!) 51/29 (!) 99/43  Pulse:    74  Resp: 19 (!) 9 12   Temp:   (!) 90 F (32.2 C)   TempSrc:   Axillary   SpO2:    (!) 82%  Weight:      Height:        Intake/Output Summary (Last 24 hours) at 2019-05-16 1119 Last data filed at May 16, 2019 0911 Gross per 24 hour  Intake 77.99 ml  Output 0 ml  Net 77.99 ml   Filed Weights   04/26/19 0235  Weight: 46.1 kg    Examination:  General exam: Appears comfortable, does not awaken, eyes slightly opened but no do not track in the room Respiratory system: Clear to auscultation.  Respiratory effort normal. Slow shallow respirations.  Cardiovascular system: S1 & S2 heard, RRR. Gastrointestinal system: Abdomen is nondistended, soft and nontender. Central nervous system: Asleep Extremities: N/A Skin: No rashes, lesions or ulcers Psychiatry: Unable to assess   Data Reviewed: I have personally reviewed following labs and imaging studies  CBC: Recent Labs  Lab 04/27/19 0251  WBC 10.4  NEUTROABS 8.4*  HGB 14.6  HCT 48.4*  MCV 100.8*  PLT [id number]*   Basic Metabolic Panel: Recent Labs  Lab 04/26/19 1659 04/27/19 0251  NA 159* 152*  K 4.3 4.1  CL 121* 116*  CO2 23 24  GLUCOSE 100* 105*  BUN 85* 73*  CREATININE 1.70* 1.42*  CALCIUM 8.5* 8.0*   GFR: Estimated Creatinine Clearance: 16.5 mL/min (A) (by C-G formula based on SCr of 1.42 mg/dL (H)). Liver Function Tests: Recent Labs  Lab 04/27/19 0251  AST 34  ALT 55*  ALKPHOS 48  BILITOT 1.0  PROT 5.2*  ALBUMIN 2.8*   No results for input(s): LIPASE, AMYLASE in the last 168 hours. No results for input(s): AMMONIA in the last 168 hours. Coagulation Profile: No results for input(s): INR, PROTIME in the last 168 hours. Cardiac Enzymes: No results for input(s): CKTOTAL, CKMB, CKMBINDEX, TROPONINI in the last 168 hours. BNP (last 3 results) No results for input(s): PROBNP in the last 8760 hours. HbA1C: No results for input(s): HGBA1C in the last 72 hours. CBG: No results for input(s): GLUCAP in the last 168 hours. Lipid Profile: No results for input(s): CHOL, HDL, LDLCALC, TRIG, CHOLHDL, LDLDIRECT in the last 72 hours. Thyroid Function Tests: No results for input(s): TSH, T4TOTAL, FREET4, T3FREE, THYROIDAB in the last 72 hours. Anemia Panel: No results for input(s): VITAMINB12, FOLATE, FERRITIN, TIBC, IRON, RETICCTPCT in the last 72 hours. Sepsis Labs: Recent Labs  Lab 04/26/19 1254  LATICACIDVEN 2.0*    Recent Results (from the past 240 hour(s))  Blood Culture (routine x 2)     Status: None   Collection Time: 04/10/2019  6:38 PM   Specimen: BLOOD  Result Value Ref Range Status   Specimen Description   Final    BLOOD RIGHT ANTECUBITAL Performed at [facility] [address], [place], [place] [address]    Special Requests   Final    BOTTLES DRAWN  AEROBIC AND ANAEROBIC Blood Culture results may not be optimal due to an inadequate volume of blood received in culture bottles Performed at [facility] [address], [place], [place] [address]    Culture   Final    NO GROWTH 5 DAYS Performed at [facility], [place] [address] [place], [place] [address]    Report Status 05/01/2019 FINAL  Final  Blood Culture (routine x 2)     Status: None   Collection Time: 03/29/2019  6:38 PM   Specimen: BLOOD  Result Value Ref Range Status   Specimen Description   Final    BLOOD RIGHT ANTECUBITAL Performed at [facility] [address] [place], [place] [address]    Special Requests   Final    BOTTLES DRAWN AEROBIC AND ANAEROBIC Blood Culture results may not be optimal due to an inadequate volume of blood received in culture bottles Performed at [facility] [address] [place], [place] [address]    Culture   Final    NO GROWTH 5 DAYS Performed at [facility], [place] [address], [place], [place] [address]  Report Status 05/01/2019 FINAL  Final  Urine culture     Status: None   Collection Time: 04/17/2019  6:38 PM   Specimen: In/Out Cath Urine  Result Value Ref Range Status   Specimen Description   Final    IN/OUT CATH URINE Performed at [facility] [address] [place], [place] [address]    Special Requests   Final    NONE Performed at [facility], [place] [address], [place], [place] [address]    Culture   Final    NO GROWTH Performed at [facility], [place] [address] [place], [place] [address]    Report Status 04/26/2019 FINAL  Final  SARS CORONAVIRUS 2 (TAT 6-24 HRS) Nasopharyngeal     Status: Abnormal   Collection Time: 04/16/2019  8:23 PM   Specimen: Nasopharyngeal  Result Value Ref Range Status   SARS Coronavirus 2 POSITIVE (A) NEGATIVE Final    Comment: RESULT CALLED TO, READ BACK BY AND VERIFIED WITH: Sharyn Creamer  RN 11:50 04/26/19 ([email or web address]) (NOTE) SARS-CoV-2 target nucleic acids are NOT DETECTED. The SARS-CoV-2 RNA is generally detectable in upper and lower respiratory specimens during the acute phase of infection. Negative results do not preclude SARS-CoV-2 infection, do not rule out co-infections with other pathogens, and should not be used as the sole basis for treatment or other patient management decisions. Negative results must be combined with clinical observations, patient history, and epidemiological information. The expected result is Negative. Fact Sheet for Patients: SugarRoll.be Fact Sheet for Healthcare Providers: https://www.woods-mathews.com/ This test is not yet approved or cleared by the [place] FDA and  has been authorized for detection and/or diagnosis of SARS-CoV-2 by FDA under an Emergency Use Authorization (EUA). This EUA will remain  in effect (mea ning this test can be used) for the duration of the COVID-19 declaration under Section 564(b)(1) of the Act, 21 U.S.C. section 360bbb-3(b)(1), unless the authorization is terminated or revoked sooner. Performed at [facility], [place] [address], [place], [place] [address]   MRSA PCR Screening     Status: Abnormal   Collection Time: 04/26/19  2:41 AM   Specimen: Nasal Mucosa; Nasopharyngeal  Result Value Ref Range Status   MRSA by PCR POSITIVE (A) NEGATIVE Final    Comment:        The GeneXpert MRSA Assay (FDA approved for NASAL specimens only), is one component of a comprehensive MRSA colonization surveillance program. It is not intended to diagnose MRSA infection nor to guide or monitor treatment for MRSA infections. RESULT CALLED TO, READ BACK BY AND VERIFIED WITHJarrett Soho LETCHFORD @ [id number] ON 04/26/2019 Sandy Salaam Performed at [facility], [place] [address] [place], [place] [address]        Radiology Studies: No results found.   Scheduled  Meds: . Chlorhexidine Gluconate Cloth  6 each Topical Daily   Continuous Infusions: . morphine 3 mg/hr (2019/05/16 0600)     LOS: 8 days    Time spent: [place], MD [facility] Pager [phone]  If 7PM-7AM, please contact night-coverage [email or web address] Password [email or web address] 05/16/2019, 11:19 AM

## 2019-05-29 NOTE — Progress Notes (Signed)
Daily Progress Note   Patient Name: Andrea Shepard       Date: May 26, 2019 DOB: [birth year]  Age: 86 y.o. MRN#: [id number] Attending Physician: Arma Heading, MD Primary Care Physician: [name], MD Admit Date: 04/06/2019  Reason for Consultation/Follow-up: Psychosocial/spiritual support and Terminal Care  Subjective: Chart reviewed.  Andrea Shepard appears to be resting comfortably on low-dose morphine infusion.  Exam is limited secondary to Covid 19 infection. She is soon to be transferred out of the stepdown unit to another floor.   Remains on comfort measures.   Length of Stay: 8  Current Medications: Scheduled Meds:  . Chlorhexidine Gluconate Cloth  6 each Topical Daily    Continuous Infusions: . morphine 3 mg/hr (May 26, 2019 0600)    PRN Meds: acetaminophen **OR** acetaminophen, antiseptic oral rinse, bisacodyl, diphenhydrAMINE, glycopyrrolate **OR** glycopyrrolate **OR** glycopyrrolate, haloperidol **OR** haloperidol **OR** haloperidol lactate, loperamide, LORazepam, morphine, morphine CONCENTRATE **OR** morphine CONCENTRATE, ondansetron **OR** ondansetron (ZOFRAN) IV, polyvinyl alcohol  Physical Exam   Somnolent. No respiratory distress. No picking or agitation.        Vital Signs: BP (!) 99/43 (BP Location: Left Arm)   Pulse 74   Temp (!) 90 F (32.2 C) (Axillary)   Resp 12   Ht 5\' 3"  (1.6 m)   Wt 46.1 kg   SpO2 (!) 82%   BMI 18.00 kg/m  SpO2: SpO2: (!) 82 % O2 Device: O2 Device: Room Air O2 Flow Rate:    Intake/output summary:   Intake/Output Summary (Last 24 hours) at May 26, 2019 1312 Last data filed at 05-26-2019 0911 Gross per 24 hour  Intake 77.99 ml  Output 0 ml  Net 77.99 ml   LBM: Last BM Date: (UTA) Baseline Weight: Weight: 46.1 kg Most recent weight:  Weight: 46.1 kg       Palliative Assessment/Data:    Flowsheet Rows     Most Recent Value  Intake Tab  Referral Department  Hospitalist  Unit at Time of Referral  Intermediate Care Unit  Palliative Care Primary Diagnosis  Sepsis/Infectious Disease  Date Notified  04/26/19  Palliative Care Type  New Palliative care  Reason for referral  Clarify Goals of Care, End of Life Care Assistance  Date of Admission  04/26/19  Date first seen by Palliative Care  04/27/19  # of days Palliative referral response  time  1 Day(s)  # of days IP prior to Palliative referral  0  Clinical Assessment  Palliative Performance Scale Score  10%  Psychosocial & Spiritual Assessment  Palliative Care Outcomes  Palliative Care Outcomes  Provided end of life care assistance      Patient Active Problem List   Diagnosis Date Noted  . Altered mental status   . Dying care   . Palliative care by specialist   . Sepsis ([place]) 04/19/2019  . Dementia ([place]) 03/30/2019  . Acute metabolic encephalopathy [id number]  . AKI (acute kidney injury) ([place]) 04/24/2019  . Hypernatremia 03/29/2019  . Dehydration 04/16/2019    Palliative Care Assessment & Plan   Patient Profile: Andrea Shepard is a 86 year old female with past medical history of dementia who presented from skilled facility.  She did not respond to trial of conservative interventions and was transitioned to focus on comfort care.  Recommendations/Plan:  Full comfort care.  Chart reviewed including use of as needed medications.  She appears comfortable on my exam today.  Continue same.   Goals of Care and Additional Recommendations:  Limitations on Scope of Treatment: Full Comfort Care  Code Status:    Code Status Orders  (From admission, onward)         Start     Ordered   04/27/19 1230  Do not attempt resuscitation (DNR)  Continuous    Question Answer Comment  In the event of cardiac or respiratory ARREST Do not call a "code blue"   In the  event of cardiac or respiratory ARREST Do not perform Intubation, CPR, defibrillation or ACLS   In the event of cardiac or respiratory ARREST Use medication by any route, position, wound care, and other measures to relive pain and suffering. May use oxygen, suction and manual treatment of airway obstruction as needed for comfort.      04/27/19 1235        Code Status History    Date Active Date Inactive Code Status Order ID Comments User Context   04/26/2019 0256 04/27/2019 1235 DNR [id number]  Phillips Grout, MD Inpatient   04/05/2019 2209 04/26/2019 0256 Full Code [id number]  Phillips Grout, MD ED   Advance Care Planning Activity       Prognosis:   Hours - Days  Discharge Planning:  Anticipated Hospital Death  Care plan was discussed with RN  Thank you for allowing the Palliative Medicine Team to assist in the care of this patient.   Total Time 15 Prolonged Time Billed No      Greater than 50%  of this time was spent counseling and coordinating care related to the above assessment and plan.  Loistine Chance, MD  Please contact Palliative Medicine Team phone at [phone] for questions and concerns.

## 2019-05-29 NOTE — Progress Notes (Signed)
Telemetry monitor alarming. RN found pt. Pulseless with no respirations.Pt. currently comfort care. Charge nurse notified. Both RNs pronounced pt. @ 2114-07-27. MD and Family notified

## 2019-05-29 NOTE — Progress Notes (Signed)
Nutrition Brief Note RD working remotely.   Chart reviewed. Patient was last assessed by this RD remotely on 12/30 d/t COVID+ patient. Palliative Care note from yesterday afternoon states that patient is full comfort care. She has been NPO since 12/31; was on CLD on 12/29 and 12/30.   No further nutrition interventions warranted at this time.  Please re-consult as needed.     Jarome Matin, MS, RD, LDN, [facility] Inpatient Clinical Dietitian Pager # [phone] After hours/weekend pager # [phone]

## 2019-05-29 DEATH — deceased

## 2020-05-01 IMAGING — DX DG CHEST 1V PORT
1 series · 1 of 1 positions shown · non-contrast
Comparison: 01/21/2018

CLINICAL DATA: Acute altered mental status and weakness.

EXAM:
PORTABLE CHEST 1 VIEW

[chest ap]
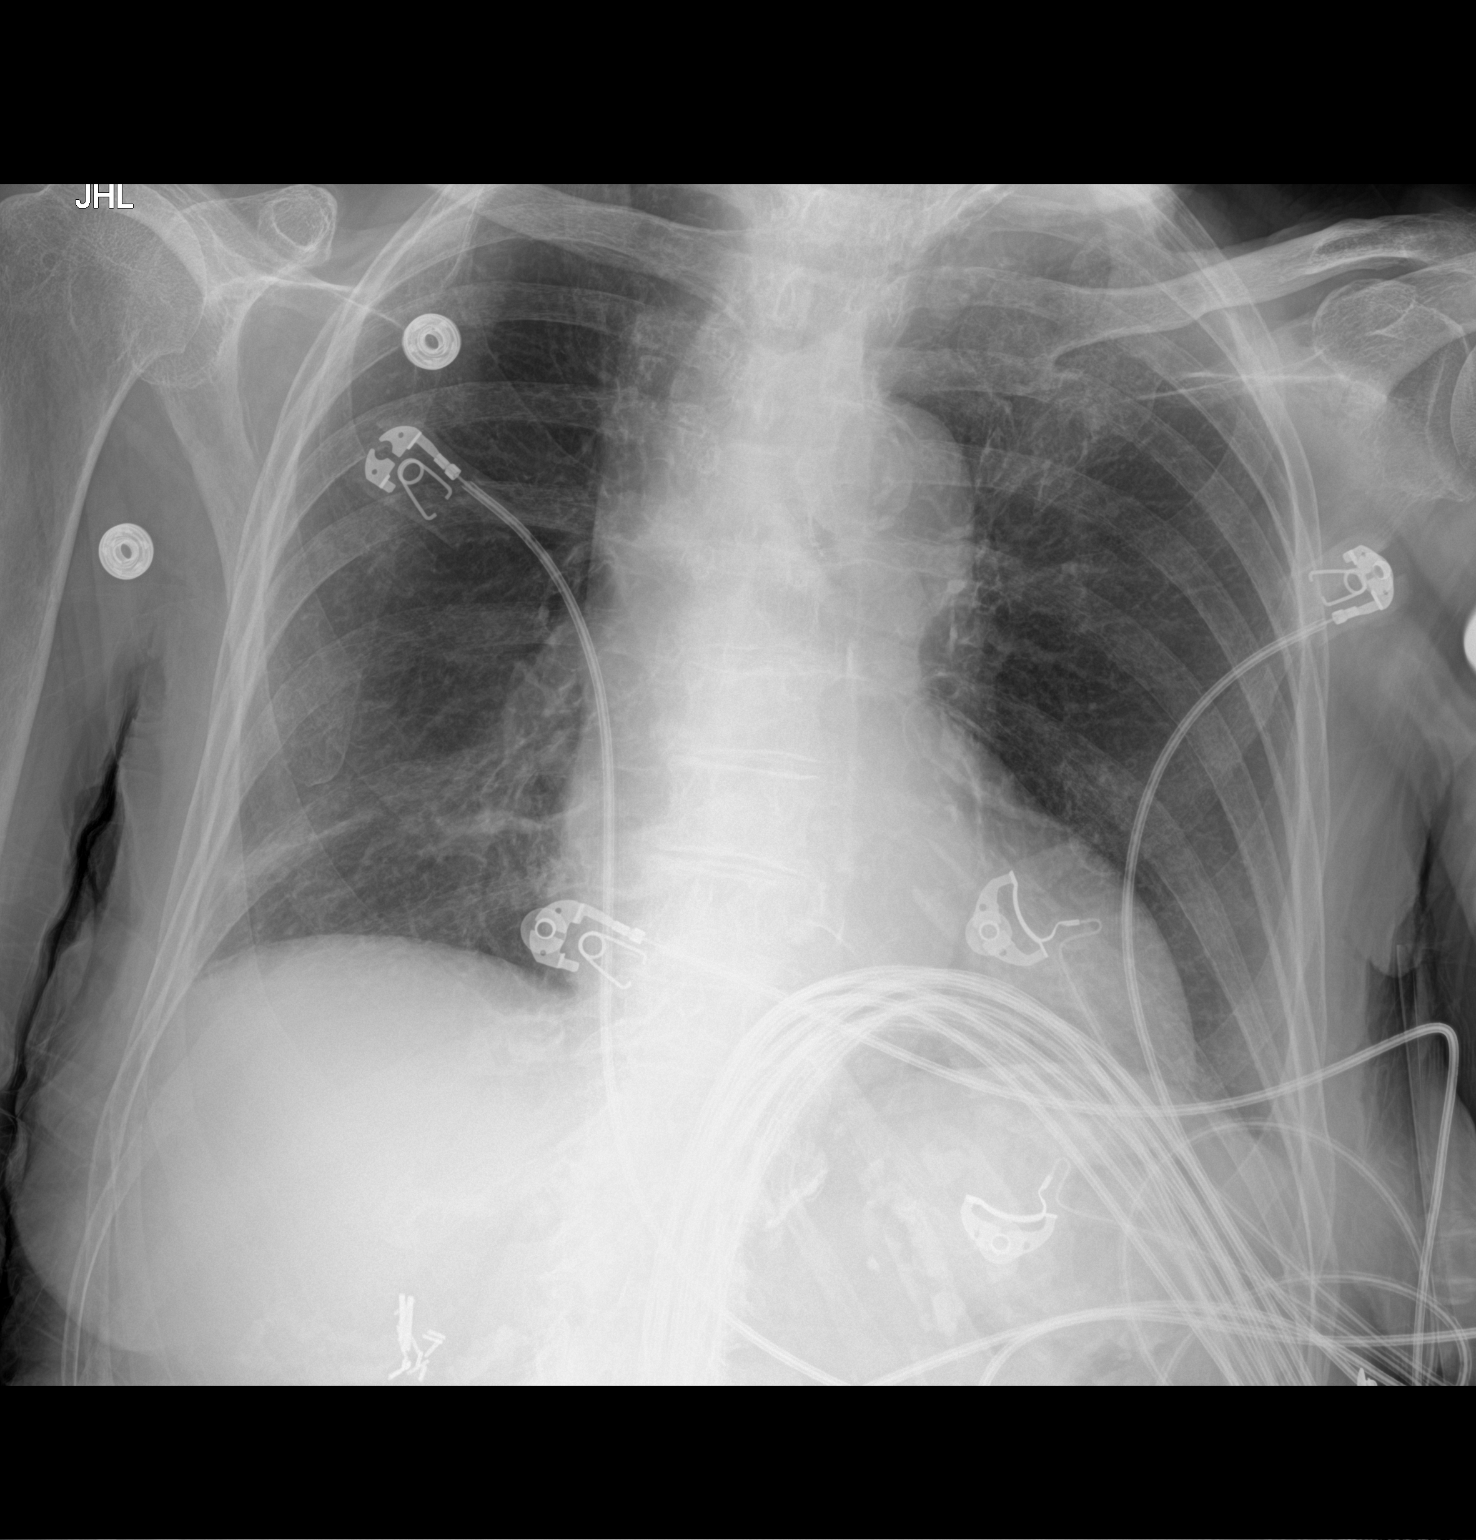

[1 of 1 positions shown; findings below may reference images not displayed]

FINDINGS: Stable mild cardiomegaly. Aortic atherosclerosis incidentally noted.
Mild scarring is seen in the right lung base. No evidence of
pulmonary infiltrate or edema. No evidence of pneumothorax or
pleural effusion.
IMPRESSION: Mild cardiomegaly.  No active lung disease.

## 2020-05-01 IMAGING — CT CT HEAD W/O CM
3 series · 16 of 47 positions shown, 19 images · non-contrast
Comparison: None.

CLINICAL DATA: Altered mental status

EXAM:
CT HEAD WITHOUT CONTRAST
TECHNIQUE: Contiguous axial images were obtained from the base of the skull
through the vertex without intravenous contrast.

[Series 3: head wo · axial · 0.39mm/px · z∈[-129,+1]mm · 10 of 32 slices shown, 13 images]
[im 3/32  brain]
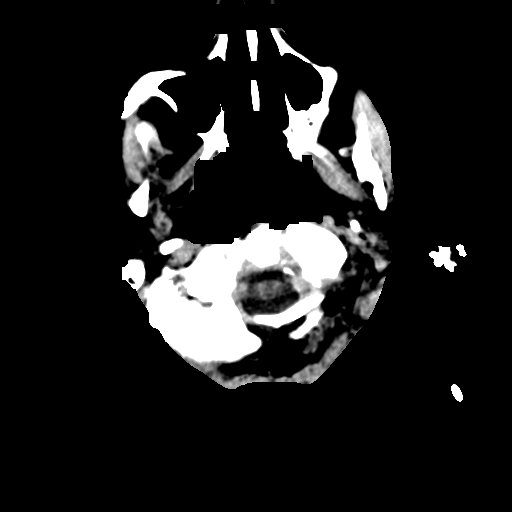
[im 3/32  bone]
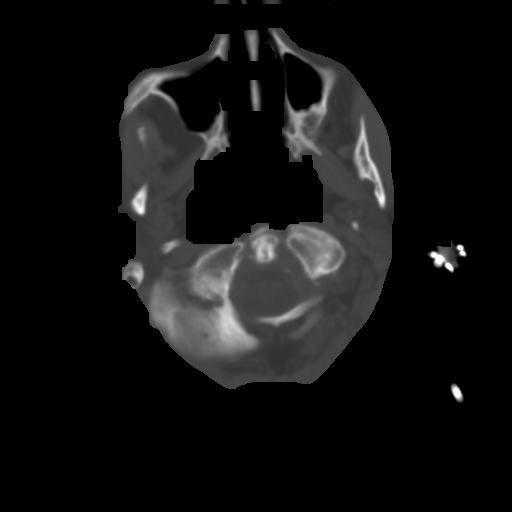
[im 6/32  brain]
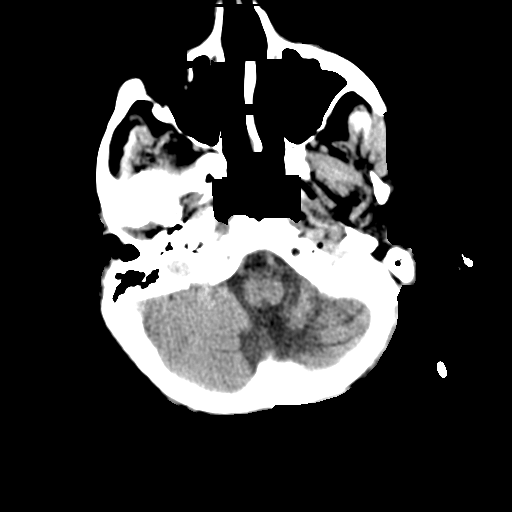
[im 9/32  brain]
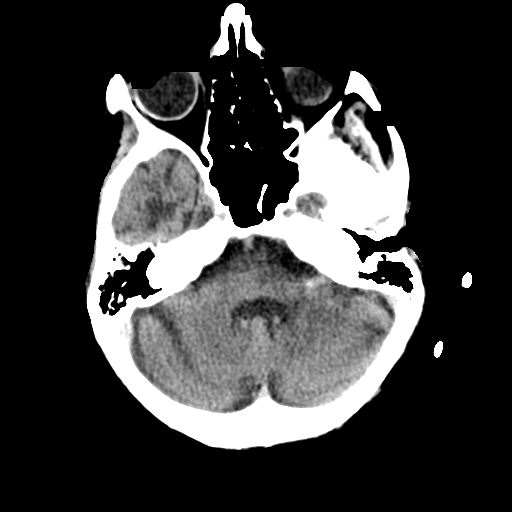
[im 11/32  brain]
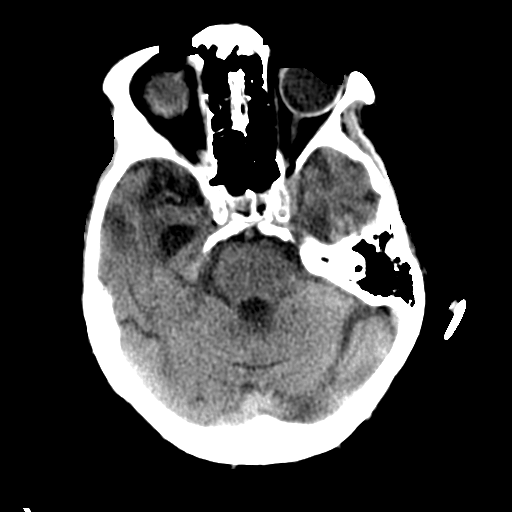
[im 14/32  brain]
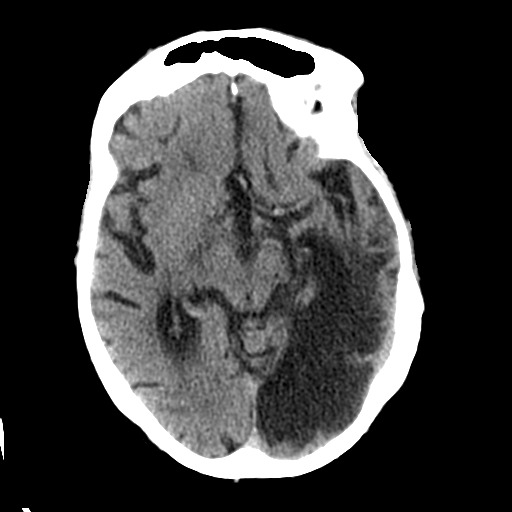
[im 14/32  bone]
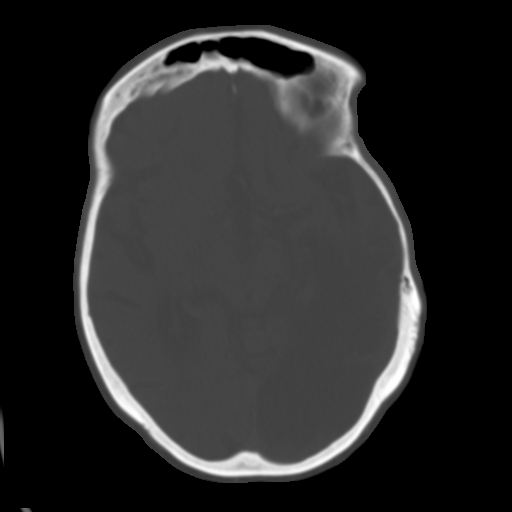
[im 18/32  brain]
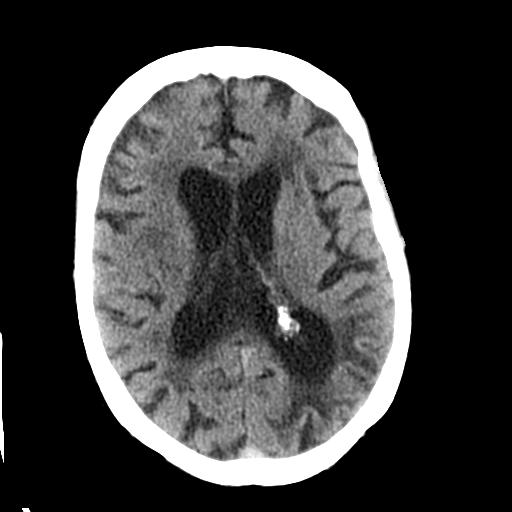
[im 21/32  brain]
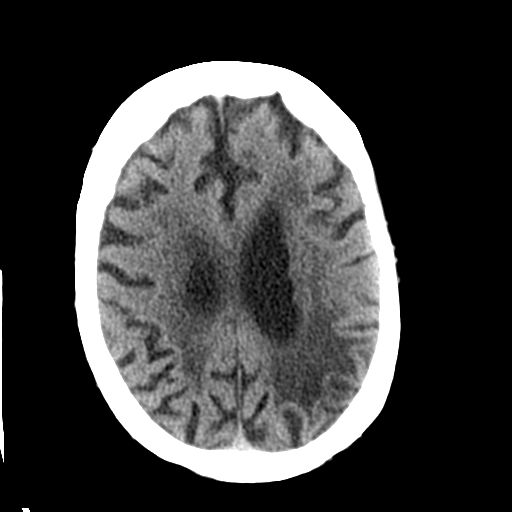
[im 24/32  brain]
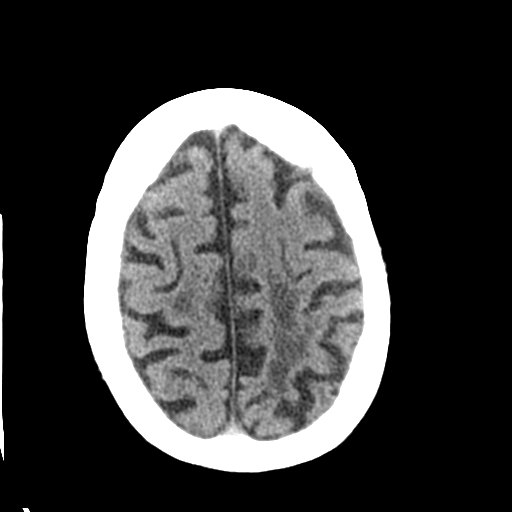
[im 26/32  brain]
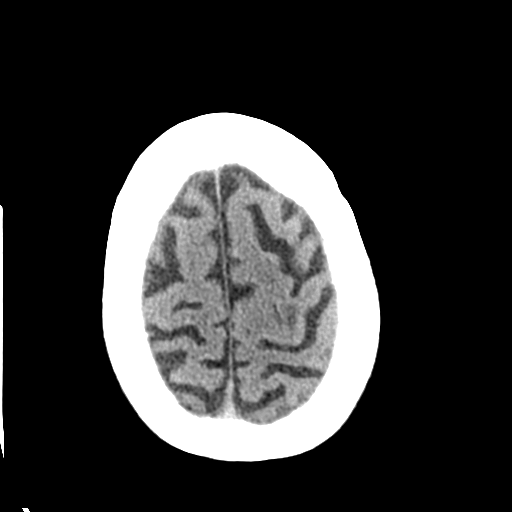
[im 26/32  bone]
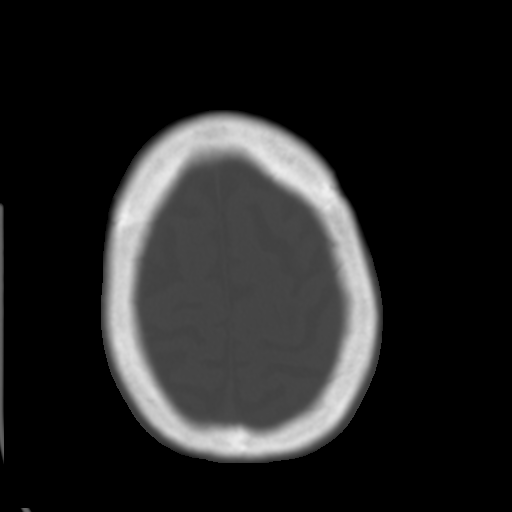
[im 29/32  brain]
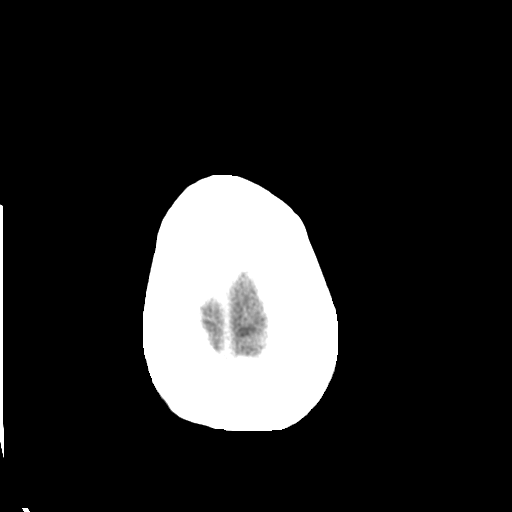

[Series 6: coronal soft tissue · coronal · 0.28mm/px · 3 of 63 slices shown]
[im 23/63  brain]
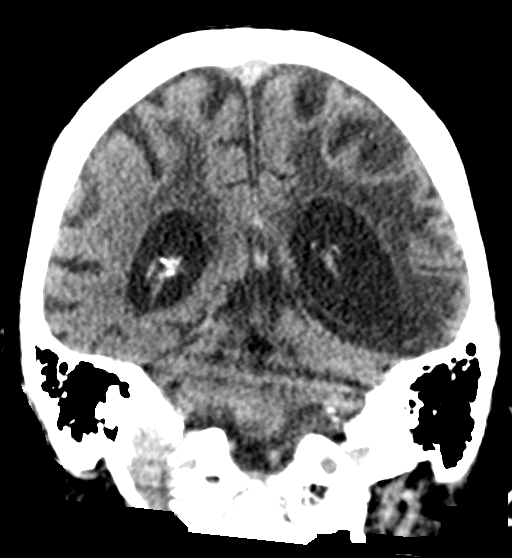
[im 29/63  brain]
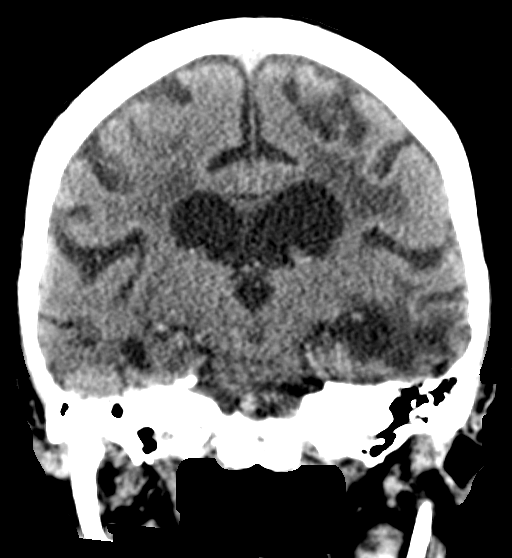
[im 35/63  brain]
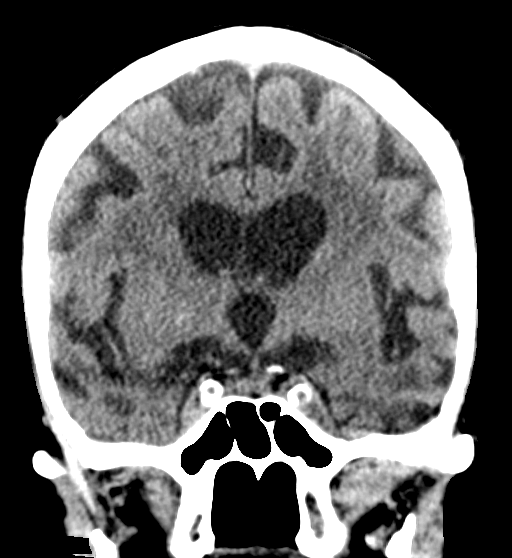

[Series 7: sagittal soft tissue · sagittal · 0.31mm/px · 3 of 47 slices shown]
[im 16/47  brain]
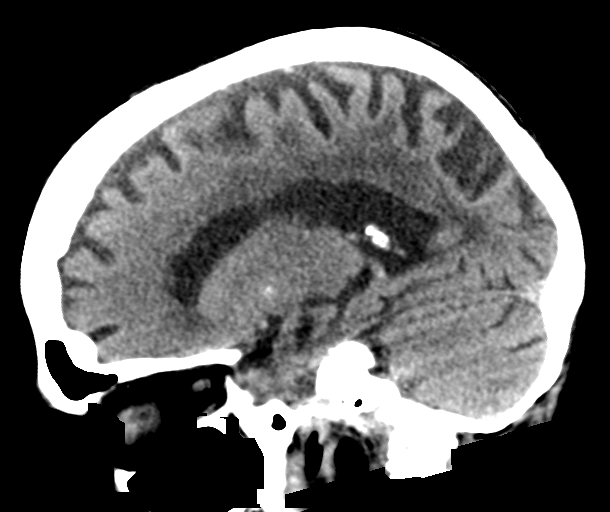
[im 24/47  brain]
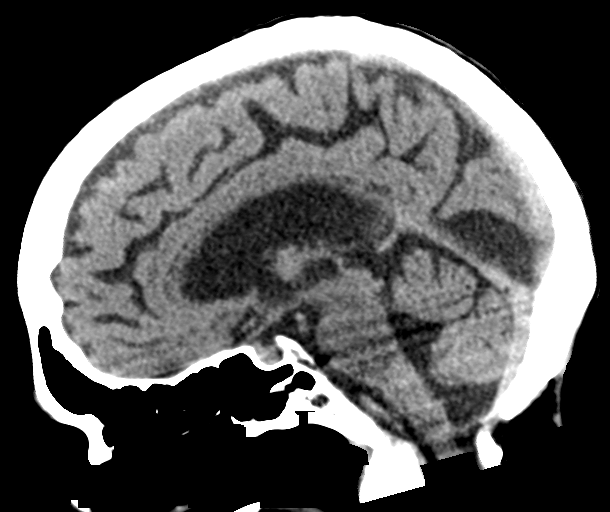
[im 31/47  brain]
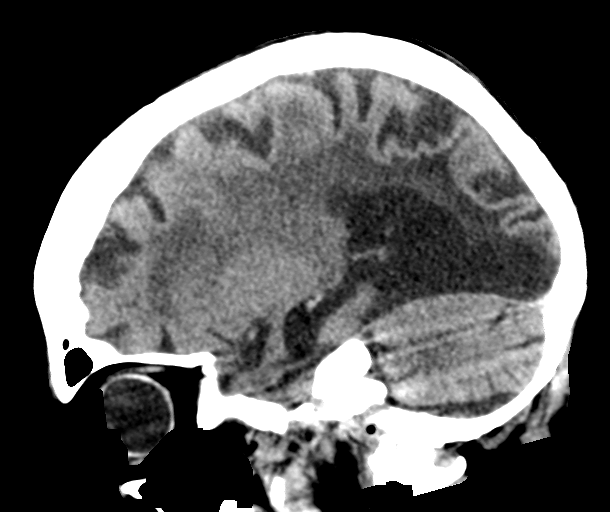

[16 of 47 positions shown; findings below may reference images not displayed]

FINDINGS: Brain: No evidence of acute infarction, hemorrhage, extra-axial
collection, ventriculomegaly, or mass effect. Severe
parietal-occipital-temporal encephalomalacia from prior insult. Mild
encephalomalacia of the right temporal lobe. Generalized cerebral
atrophy. Periventricular white matter low attenuation likely
secondary to microangiopathy.

Vascular: Cerebrovascular atherosclerotic calcifications are noted.

Skull: Negative for fracture or focal lesion.

Sinuses/Orbits: Visualized portions of the orbits are unremarkable.
Visualized portions of the paranasal sinuses are unremarkable.
Visualized portions of the mastoid air cells are unremarkable.

Other: None.
IMPRESSION: 1.   1.  No acute intracranial pathology.
2. Chronic microvascular disease and cerebral atrophy.
3. Severe parietal-occipital-temporal encephalomalacia from prior
insult. Mild encephalomalacia of the right temporal lobe.
